# Patient Record
Sex: Female | Born: 1975 | Race: White | Hispanic: No | Marital: Married | Smoking: Never smoker
Health system: Southern US, Community
[De-identification: ages and names within clinical notes are randomized; demographics above are authoritative.]

## PROBLEM LIST (undated history)

## (undated) DIAGNOSIS — B9689 Other specified bacterial agents as the cause of diseases classified elsewhere: Secondary | ICD-10-CM

## (undated) DIAGNOSIS — R609 Edema, unspecified: Secondary | ICD-10-CM

## (undated) DIAGNOSIS — M2550 Pain in unspecified joint: Secondary | ICD-10-CM

## (undated) DIAGNOSIS — R0602 Shortness of breath: Secondary | ICD-10-CM

## (undated) DIAGNOSIS — N898 Other specified noninflammatory disorders of vagina: Secondary | ICD-10-CM

## (undated) DIAGNOSIS — E739 Lactose intolerance, unspecified: Secondary | ICD-10-CM

## (undated) DIAGNOSIS — K219 Gastro-esophageal reflux disease without esophagitis: Secondary | ICD-10-CM

## (undated) DIAGNOSIS — IMO0002 Reserved for concepts with insufficient information to code with codable children: Principal | ICD-10-CM

## (undated) DIAGNOSIS — F419 Anxiety disorder, unspecified: Secondary | ICD-10-CM

## (undated) DIAGNOSIS — R1310 Dysphagia, unspecified: Secondary | ICD-10-CM

## (undated) DIAGNOSIS — K5900 Constipation, unspecified: Secondary | ICD-10-CM

## (undated) DIAGNOSIS — N760 Acute vaginitis: Secondary | ICD-10-CM

## (undated) HISTORY — DX: Other specified bacterial agents as the cause of diseases classified elsewhere: B96.89

## (undated) HISTORY — DX: Constipation, unspecified: K59.00

## (undated) HISTORY — DX: Other specified noninflammatory disorders of vagina: N89.8

## (undated) HISTORY — DX: Dysphagia, unspecified: R13.10

## (undated) HISTORY — DX: Reserved for concepts with insufficient information to code with codable children: IMO0002

## (undated) HISTORY — PX: TUBAL LIGATION: SHX77

## (undated) HISTORY — DX: Lactose intolerance, unspecified: E73.9

## (undated) HISTORY — DX: Pain in unspecified joint: M25.50

## (undated) HISTORY — PX: KNEE ARTHROSCOPY: SHX127

## (undated) HISTORY — PX: MANDIBLE SURGERY: SHX707

## (undated) HISTORY — DX: Shortness of breath: R06.02

## (undated) HISTORY — DX: Edema, unspecified: R60.9

## (undated) HISTORY — DX: Acute vaginitis: N76.0

---

## 1998-01-13 ENCOUNTER — Inpatient Hospital Stay (HOSPITAL_COMMUNITY): Admission: AD | Admit: 1998-01-13 | Discharge: 1998-01-13 | Payer: Self-pay | Admitting: Family Medicine

## 1998-01-29 ENCOUNTER — Inpatient Hospital Stay (HOSPITAL_COMMUNITY): Admission: AD | Admit: 1998-01-29 | Discharge: 1998-01-29 | Payer: Self-pay | Admitting: Family Medicine

## 1998-01-30 ENCOUNTER — Inpatient Hospital Stay (HOSPITAL_COMMUNITY): Admission: AD | Admit: 1998-01-30 | Discharge: 1998-01-30 | Payer: Self-pay | Admitting: Family Medicine

## 1998-02-04 ENCOUNTER — Inpatient Hospital Stay (HOSPITAL_COMMUNITY): Admission: AD | Admit: 1998-02-04 | Discharge: 1998-02-04 | Payer: Self-pay | Admitting: Family Medicine

## 1998-02-19 ENCOUNTER — Ambulatory Visit (HOSPITAL_COMMUNITY): Admission: RE | Admit: 1998-02-19 | Discharge: 1998-02-19 | Payer: Self-pay | Admitting: Family Medicine

## 1998-02-20 ENCOUNTER — Inpatient Hospital Stay (HOSPITAL_COMMUNITY): Admission: AD | Admit: 1998-02-20 | Discharge: 1998-02-20 | Payer: Self-pay | Admitting: Family Medicine

## 1998-03-05 ENCOUNTER — Inpatient Hospital Stay (HOSPITAL_COMMUNITY): Admission: AD | Admit: 1998-03-05 | Discharge: 1998-03-05 | Payer: Self-pay | Admitting: Family Medicine

## 1998-03-21 ENCOUNTER — Inpatient Hospital Stay (HOSPITAL_COMMUNITY): Admission: AD | Admit: 1998-03-21 | Discharge: 1998-03-24 | Payer: Self-pay | Admitting: Family Medicine

## 1998-04-16 ENCOUNTER — Encounter: Admission: RE | Admit: 1998-04-16 | Discharge: 1998-07-15 | Payer: Self-pay | Admitting: Family Medicine

## 1998-05-02 ENCOUNTER — Other Ambulatory Visit: Admission: RE | Admit: 1998-05-02 | Discharge: 1998-05-02 | Payer: Self-pay | Admitting: Family Medicine

## 2000-07-29 ENCOUNTER — Other Ambulatory Visit: Admission: RE | Admit: 2000-07-29 | Discharge: 2000-07-29 | Payer: Self-pay | Admitting: *Deleted

## 2000-07-30 ENCOUNTER — Emergency Department (HOSPITAL_COMMUNITY): Admission: EM | Admit: 2000-07-30 | Discharge: 2000-07-31 | Payer: Self-pay | Admitting: Emergency Medicine

## 2000-07-31 ENCOUNTER — Encounter: Payer: Self-pay | Admitting: Emergency Medicine

## 2001-01-21 ENCOUNTER — Ambulatory Visit (HOSPITAL_COMMUNITY): Admission: RE | Admit: 2001-01-21 | Discharge: 2001-01-21 | Payer: Self-pay | Admitting: Family Medicine

## 2001-01-21 ENCOUNTER — Encounter: Payer: Self-pay | Admitting: Family Medicine

## 2001-02-03 ENCOUNTER — Encounter: Admission: RE | Admit: 2001-02-03 | Discharge: 2001-03-24 | Payer: Self-pay | Admitting: Orthopedic Surgery

## 2001-05-11 ENCOUNTER — Other Ambulatory Visit: Admission: RE | Admit: 2001-05-11 | Discharge: 2001-05-11 | Payer: Self-pay | Admitting: Obstetrics and Gynecology

## 2001-06-10 ENCOUNTER — Ambulatory Visit (HOSPITAL_COMMUNITY): Admission: RE | Admit: 2001-06-10 | Discharge: 2001-06-10 | Payer: Self-pay | Admitting: Obstetrics and Gynecology

## 2001-06-10 ENCOUNTER — Encounter: Payer: Self-pay | Admitting: Obstetrics and Gynecology

## 2001-06-15 ENCOUNTER — Encounter: Payer: Self-pay | Admitting: Obstetrics and Gynecology

## 2001-06-15 ENCOUNTER — Ambulatory Visit (HOSPITAL_COMMUNITY): Admission: RE | Admit: 2001-06-15 | Discharge: 2001-06-15 | Payer: Self-pay | Admitting: Obstetrics and Gynecology

## 2001-07-13 ENCOUNTER — Encounter: Payer: Self-pay | Admitting: Obstetrics and Gynecology

## 2001-07-13 ENCOUNTER — Ambulatory Visit (HOSPITAL_COMMUNITY): Admission: RE | Admit: 2001-07-13 | Discharge: 2001-07-13 | Payer: Self-pay | Admitting: Obstetrics and Gynecology

## 2001-08-17 ENCOUNTER — Inpatient Hospital Stay (HOSPITAL_COMMUNITY): Admission: AD | Admit: 2001-08-17 | Discharge: 2001-08-17 | Payer: Self-pay | Admitting: Obstetrics and Gynecology

## 2001-08-25 ENCOUNTER — Inpatient Hospital Stay (HOSPITAL_COMMUNITY): Admission: RE | Admit: 2001-08-25 | Discharge: 2001-08-25 | Payer: Self-pay | Admitting: Obstetrics and Gynecology

## 2001-09-01 ENCOUNTER — Inpatient Hospital Stay (HOSPITAL_COMMUNITY): Admission: AD | Admit: 2001-09-01 | Discharge: 2001-09-01 | Payer: Self-pay | Admitting: Obstetrics and Gynecology

## 2001-09-02 ENCOUNTER — Inpatient Hospital Stay (HOSPITAL_COMMUNITY): Admission: AD | Admit: 2001-09-02 | Discharge: 2001-09-02 | Payer: Self-pay | Admitting: Obstetrics and Gynecology

## 2001-10-18 ENCOUNTER — Inpatient Hospital Stay (HOSPITAL_COMMUNITY): Admission: AD | Admit: 2001-10-18 | Discharge: 2001-10-18 | Payer: Self-pay | Admitting: Obstetrics and Gynecology

## 2001-10-21 ENCOUNTER — Inpatient Hospital Stay (HOSPITAL_COMMUNITY): Admission: AD | Admit: 2001-10-21 | Discharge: 2001-10-21 | Payer: Self-pay | Admitting: Obstetrics and Gynecology

## 2001-10-28 ENCOUNTER — Inpatient Hospital Stay (HOSPITAL_COMMUNITY): Admission: AD | Admit: 2001-10-28 | Discharge: 2001-10-28 | Payer: Self-pay | Admitting: Obstetrics and Gynecology

## 2001-11-01 ENCOUNTER — Inpatient Hospital Stay (HOSPITAL_COMMUNITY): Admission: AD | Admit: 2001-11-01 | Discharge: 2001-11-03 | Payer: Self-pay | Admitting: Obstetrics and Gynecology

## 2002-01-11 ENCOUNTER — Emergency Department (HOSPITAL_COMMUNITY): Admission: EM | Admit: 2002-01-11 | Discharge: 2002-01-11 | Payer: Self-pay | Admitting: Emergency Medicine

## 2002-01-11 ENCOUNTER — Encounter: Payer: Self-pay | Admitting: Emergency Medicine

## 2002-03-23 ENCOUNTER — Encounter (INDEPENDENT_AMBULATORY_CARE_PROVIDER_SITE_OTHER): Payer: Self-pay | Admitting: *Deleted

## 2002-03-23 ENCOUNTER — Ambulatory Visit (HOSPITAL_BASED_OUTPATIENT_CLINIC_OR_DEPARTMENT_OTHER): Admission: RE | Admit: 2002-03-23 | Discharge: 2002-03-23 | Payer: Self-pay | Admitting: Orthopedic Surgery

## 2002-07-14 ENCOUNTER — Other Ambulatory Visit: Admission: RE | Admit: 2002-07-14 | Discharge: 2002-07-14 | Payer: Self-pay | Admitting: Obstetrics and Gynecology

## 2002-08-16 ENCOUNTER — Ambulatory Visit (HOSPITAL_COMMUNITY): Admission: RE | Admit: 2002-08-16 | Discharge: 2002-08-16 | Payer: Self-pay | Admitting: Obstetrics and Gynecology

## 2002-08-16 ENCOUNTER — Encounter: Payer: Self-pay | Admitting: Obstetrics and Gynecology

## 2003-04-10 ENCOUNTER — Ambulatory Visit (HOSPITAL_COMMUNITY): Admission: RE | Admit: 2003-04-10 | Discharge: 2003-04-10 | Payer: Self-pay | Admitting: Obstetrics and Gynecology

## 2003-04-10 ENCOUNTER — Encounter: Payer: Self-pay | Admitting: Obstetrics and Gynecology

## 2003-06-16 ENCOUNTER — Inpatient Hospital Stay (HOSPITAL_COMMUNITY): Admission: AD | Admit: 2003-06-16 | Discharge: 2003-06-16 | Payer: Self-pay | Admitting: Obstetrics and Gynecology

## 2003-08-23 ENCOUNTER — Inpatient Hospital Stay (HOSPITAL_COMMUNITY): Admission: AD | Admit: 2003-08-23 | Discharge: 2003-08-23 | Payer: Self-pay | Admitting: Obstetrics and Gynecology

## 2003-08-26 ENCOUNTER — Inpatient Hospital Stay (HOSPITAL_COMMUNITY): Admission: AD | Admit: 2003-08-26 | Discharge: 2003-08-26 | Payer: Self-pay | Admitting: Obstetrics and Gynecology

## 2003-08-30 ENCOUNTER — Inpatient Hospital Stay (HOSPITAL_COMMUNITY): Admission: AD | Admit: 2003-08-30 | Discharge: 2003-09-01 | Payer: Self-pay | Admitting: Obstetrics and Gynecology

## 2003-08-31 ENCOUNTER — Encounter (INDEPENDENT_AMBULATORY_CARE_PROVIDER_SITE_OTHER): Payer: Self-pay | Admitting: *Deleted

## 2003-11-01 ENCOUNTER — Other Ambulatory Visit: Admission: RE | Admit: 2003-11-01 | Discharge: 2003-11-01 | Payer: Self-pay | Admitting: Obstetrics and Gynecology

## 2005-10-04 ENCOUNTER — Emergency Department (HOSPITAL_COMMUNITY): Admission: EM | Admit: 2005-10-04 | Discharge: 2005-10-04 | Payer: Self-pay | Admitting: Emergency Medicine

## 2009-01-22 DIAGNOSIS — R109 Unspecified abdominal pain: Secondary | ICD-10-CM | POA: Insufficient documentation

## 2009-01-22 DIAGNOSIS — R110 Nausea: Secondary | ICD-10-CM | POA: Insufficient documentation

## 2009-01-22 DIAGNOSIS — R1011 Right upper quadrant pain: Secondary | ICD-10-CM | POA: Insufficient documentation

## 2009-01-23 ENCOUNTER — Ambulatory Visit: Payer: Self-pay | Admitting: Gastroenterology

## 2009-07-01 ENCOUNTER — Emergency Department (HOSPITAL_COMMUNITY): Admission: EM | Admit: 2009-07-01 | Discharge: 2009-07-01 | Payer: Self-pay | Admitting: Emergency Medicine

## 2010-02-05 ENCOUNTER — Emergency Department (HOSPITAL_COMMUNITY): Admission: EM | Admit: 2010-02-05 | Discharge: 2010-02-05 | Payer: Self-pay | Admitting: Emergency Medicine

## 2010-02-09 ENCOUNTER — Emergency Department (HOSPITAL_COMMUNITY): Admission: EM | Admit: 2010-02-09 | Discharge: 2010-02-09 | Payer: Self-pay | Admitting: Emergency Medicine

## 2011-01-22 LAB — POCT I-STAT, CHEM 8
BUN: 9 mg/dL (ref 6–23)
Calcium, Ion: 1.2 mmol/L (ref 1.12–1.32)
Chloride: 105 mEq/L (ref 96–112)
Creatinine, Ser: 0.7 mg/dL (ref 0.4–1.2)
Glucose, Bld: 98 mg/dL (ref 70–99)
HCT: 40 % (ref 36.0–46.0)
Hemoglobin: 13.6 g/dL (ref 12.0–15.0)
Potassium: 3.7 mEq/L (ref 3.5–5.1)
Sodium: 140 mEq/L (ref 135–145)
TCO2: 25 mmol/L (ref 0–100)

## 2011-01-22 LAB — DIFFERENTIAL
Basophils Absolute: 0.1 10*3/uL (ref 0.0–0.1)
Basophils Relative: 1 % (ref 0–1)
Eosinophils Absolute: 0.3 10*3/uL (ref 0.0–0.7)
Eosinophils Relative: 3 % (ref 0–5)
Lymphocytes Relative: 15 % (ref 12–46)
Lymphs Abs: 1.4 10*3/uL (ref 0.7–4.0)
Monocytes Absolute: 0.4 10*3/uL (ref 0.1–1.0)
Monocytes Relative: 5 % (ref 3–12)
Neutro Abs: 7.1 10*3/uL (ref 1.7–7.7)
Neutrophils Relative %: 76 % (ref 43–77)

## 2011-01-22 LAB — URINALYSIS, ROUTINE W REFLEX MICROSCOPIC
Bilirubin Urine: NEGATIVE
Glucose, UA: NEGATIVE mg/dL
Glucose, UA: NEGATIVE mg/dL
Hgb urine dipstick: NEGATIVE
Ketones, ur: NEGATIVE mg/dL
Ketones, ur: NEGATIVE mg/dL
Nitrite: NEGATIVE
Nitrite: NEGATIVE
Protein, ur: NEGATIVE mg/dL
Protein, ur: NEGATIVE mg/dL
Specific Gravity, Urine: 1.022 (ref 1.005–1.030)
Specific Gravity, Urine: 1.027 (ref 1.005–1.030)
Urobilinogen, UA: 1 mg/dL (ref 0.0–1.0)
Urobilinogen, UA: 1 mg/dL (ref 0.0–1.0)
pH: 5.5 (ref 5.0–8.0)
pH: 7 (ref 5.0–8.0)

## 2011-01-22 LAB — GC/CHLAMYDIA PROBE AMP, GENITAL
Chlamydia, DNA Probe: NEGATIVE
GC Probe Amp, Genital: NEGATIVE

## 2011-01-22 LAB — WET PREP, GENITAL
Trich, Wet Prep: NONE SEEN
Yeast Wet Prep HPF POC: NONE SEEN

## 2011-01-22 LAB — CBC
HCT: 38 % (ref 36.0–46.0)
HCT: 39 % (ref 36.0–46.0)
Hemoglobin: 12.9 g/dL (ref 12.0–15.0)
Hemoglobin: 13.5 g/dL (ref 12.0–15.0)
MCHC: 34 g/dL (ref 30.0–36.0)
MCHC: 34.5 g/dL (ref 30.0–36.0)
MCV: 82.6 fL (ref 78.0–100.0)
MCV: 83.7 fL (ref 78.0–100.0)
Platelets: 214 10*3/uL (ref 150–400)
Platelets: 252 10*3/uL (ref 150–400)
RBC: 4.53 MIL/uL (ref 3.87–5.11)
RBC: 4.73 MIL/uL (ref 3.87–5.11)
RDW: 13.2 % (ref 11.5–15.5)
RDW: 13.4 % (ref 11.5–15.5)
WBC: 9.3 10*3/uL (ref 4.0–10.5)
WBC: 9.6 10*3/uL (ref 4.0–10.5)

## 2011-01-22 LAB — COMPREHENSIVE METABOLIC PANEL
ALT: 29 U/L (ref 0–35)
AST: 33 U/L (ref 0–37)
Albumin: 3.8 g/dL (ref 3.5–5.2)
Alkaline Phosphatase: 63 U/L (ref 39–117)
BUN: 7 mg/dL (ref 6–23)
CO2: 26 mEq/L (ref 19–32)
Calcium: 9 mg/dL (ref 8.4–10.5)
Chloride: 105 mEq/L (ref 96–112)
Creatinine, Ser: 0.86 mg/dL (ref 0.4–1.2)
GFR calc Af Amer: >60 mL/min (ref >60)
GFR calc non Af Amer: >60 mL/min (ref >60)
Glucose, Bld: 101 mg/dL — ABNORMAL HIGH (ref 70–99)
Potassium: 4 mEq/L (ref 3.5–5.1)
Sodium: 137 mEq/L (ref 135–145)
Total Bilirubin: 0.9 mg/dL (ref 0.3–1.2)
Total Protein: 7.2 g/dL (ref 6.0–8.3)

## 2011-01-22 LAB — URINE MICROSCOPIC-ADD ON

## 2011-01-22 LAB — POCT PREGNANCY, URINE: Preg Test, Ur: NEGATIVE

## 2011-01-22 LAB — PREGNANCY, URINE: Preg Test, Ur: NEGATIVE

## 2011-02-08 LAB — COMPREHENSIVE METABOLIC PANEL
ALT: 16 U/L (ref 0–35)
AST: 21 U/L (ref 0–37)
Albumin: 3.3 g/dL — ABNORMAL LOW (ref 3.5–5.2)
Alkaline Phosphatase: 50 U/L (ref 39–117)
BUN: 9 mg/dL (ref 6–23)
CO2: 24 mEq/L (ref 19–32)
Calcium: 8.6 mg/dL (ref 8.4–10.5)
Chloride: 107 mEq/L (ref 96–112)
Creatinine, Ser: 0.9 mg/dL (ref 0.4–1.2)
GFR calc Af Amer: >60 mL/min (ref >60)
GFR calc non Af Amer: >60 mL/min (ref >60)
Glucose, Bld: 99 mg/dL (ref 70–99)
Potassium: 4.4 mEq/L (ref 3.5–5.1)
Sodium: 139 mEq/L (ref 135–145)
Total Bilirubin: 0.5 mg/dL (ref 0.3–1.2)
Total Protein: 6.5 g/dL (ref 6.0–8.3)

## 2011-02-08 LAB — GC/CHLAMYDIA PROBE AMP, GENITAL
Chlamydia, DNA Probe: NEGATIVE
GC Probe Amp, Genital: NEGATIVE

## 2011-02-08 LAB — WET PREP, GENITAL
Trich, Wet Prep: NONE SEEN
Yeast Wet Prep HPF POC: NONE SEEN

## 2011-02-08 LAB — DIFFERENTIAL
Basophils Absolute: 0.1 10*3/uL (ref 0.0–0.1)
Basophils Relative: 1 % (ref 0–1)
Eosinophils Absolute: 0.2 10*3/uL (ref 0.0–0.7)
Eosinophils Relative: 3 % (ref 0–5)
Lymphocytes Relative: 18 % (ref 12–46)
Lymphs Abs: 1.3 10*3/uL (ref 0.7–4.0)
Monocytes Absolute: 0.4 10*3/uL (ref 0.1–1.0)
Monocytes Relative: 6 % (ref 3–12)
Neutro Abs: 5.2 10*3/uL (ref 1.7–7.7)
Neutrophils Relative %: 73 % (ref 43–77)

## 2011-02-08 LAB — URINALYSIS, ROUTINE W REFLEX MICROSCOPIC
Bilirubin Urine: NEGATIVE
Glucose, UA: NEGATIVE mg/dL
Hgb urine dipstick: NEGATIVE
Ketones, ur: NEGATIVE mg/dL
Nitrite: NEGATIVE
Protein, ur: NEGATIVE mg/dL
Specific Gravity, Urine: 1.03 (ref 1.005–1.030)
Urobilinogen, UA: 1 mg/dL (ref 0.0–1.0)
pH: 6 (ref 5.0–8.0)

## 2011-02-08 LAB — CBC
HCT: 36.4 % (ref 36.0–46.0)
Hemoglobin: 12.6 g/dL (ref 12.0–15.0)
MCHC: 34.5 g/dL (ref 30.0–36.0)
MCV: 84.2 fL (ref 78.0–100.0)
Platelets: 213 10*3/uL (ref 150–400)
RBC: 4.33 MIL/uL (ref 3.87–5.11)
RDW: 12.7 % (ref 11.5–15.5)
WBC: 7.2 10*3/uL (ref 4.0–10.5)

## 2011-02-08 LAB — PREGNANCY, URINE: Preg Test, Ur: NEGATIVE

## 2011-03-21 NOTE — Op Note (Signed)
[place]  Patient:    KAMAIYA, ANTILLA Visit Number: [id number] MRN: [id number]          Service Type: DSU Location: [facility] Attending Physician:  Milly Jakob Dictated by:   Harvie Junior, M.D. Proc. Date: 03/23/02 Admit Date:  03/23/2002                             Operative Report  PREOPERATIVE DIAGNOSIS:  Patellofemoral pain.  POSTOPERATIVE DIAGNOSES: 1. Patellofemoral pain with lateral patellar tracking. 2. 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  PROCEDURES: 1. Three-compartment extensive synovectomy. 2. Debridement of chondromalacia, lateral femoral condyle. 3. Lateral retinacular release.  SURGEON:  Harvie Junior, M.D.  ASSISTANT:  Currie Paris. Thedore Mins.  ANESTHESIA:  General.  BRIEF HISTORY:  She is a 35 year old female with a long history of having significant left knee pain with recurrent effusions and pain.  She ultimately had an MRI performed, which showed that she had some patellar thinning on the lateral trochlea.  Because of continued complaints of pain, she was ultimately taken to the operating room for a knee arthroscopy.  DESCRIPTION OF PROCEDURE:  Patient brought to the operating room and after an adequate level of anesthesia was obtained with a general anesthetic, the patient was positioned supine on the operating table.  The left leg was prepped and draped in the usual sterile fashion.  Following this, routine arthroscopic examination of the knee revealed that there was an obvious extensive synovial process that was suspected to be pigmented villonodular synovitis.  The multiple biopsies were taken of this synovial tissue and sent for pathology.  At this point an extensive three-compartment synovectomy was taken medially, laterally, as well as superiorly.  Once the synovectomy was completed, attention was turned back to patellar tracking, where there was some lateral patellar  tracking and some injury to the trochlea on the lateral side, and this was debrided with a suction shaver.  Attention was then turned to the lateral retinaculum, where it was noted to be tilting the patella laterally and there was lateral patellar tracking.  A lateral retinacular release was performed at this point.  Following this a final check was made, and further synovectomy was performed of the extensive synovial process. Following this the knee was copiously irrigated and suctioned dry.  The arthroscopic portals were closed with bandages, a sterile compressive dressing was applied, and the patient was taken to the recovery room, where she was noted to be in satisfactory condition.  Estimated blood loss for the procedure was less than 25 cc. Dictated by:   Harvie Junior, M.D. Attending Physician:  Milly Jakob DD:  03/23/02 TD:  03/24/02 Job: [id number]

## 2011-03-21 NOTE — Discharge Summary (Signed)
NAME:  Susan Wheeler, Susan Wheeler                          ACCOUNT NO.:  [id number]   MEDICAL RECORD NO.:  [id number]                   PATIENT TYPE:  INP   LOCATION:  9131                                 FACILITY:  [facility]   PHYSICIAN:  Huel Cote, M.D.              DATE OF BIRTH:  June 01, 1976   DATE OF ADMISSION:  08/30/2003  DATE OF DISCHARGE:  09/01/2003                                 DISCHARGE SUMMARY   DISCHARGE DIAGNOSES:  1. Term pregnancy at 38 weeks, delivered.  2. Status post normal spontaneous vaginal delivery.  3. Status post postpartum bilateral tubal ligation.   DISCHARGE MEDICATIONS:  1. Motrin 600 mg p.o. q.6h.  2. Percocet one to two tablets p.o. q.4h. p.r.n.   DISCHARGE FOLLOWUP:  The patient is to follow up in the office in two weeks  for an incision check and again in six weeks for her full postpartum  examination.   HOSPITAL COURSE:  The patient is a 35 year old G3, P2-0-0-2 at 38+ weeks who  is admitted for induction of labor given cervical dilation to 3-4 cm and a  history of quick labor living approximately 45 minutes from the hospital.  The patient's prenatal care was essentially uncomplicated.  She was Rh  negative and did receive RhoGAM.   PRENATAL LABORATORIES:  A-.  Antibody negative.  RPR nonreactive.  Rubella  equivocal.  Hepatitis C surface antigen negative.  HIV negative.  GC  negative.  Chlamydia negative.  Group B Strep negative.  Triple screen  negative.   PAST OBSTETRICAL HISTORY:  She had a normal spontaneous vaginal delivery in  1999 of a 7 pound 2 ounce infant at 40 weeks.  In 2002 she had another  vaginal delivery of a 6 pound 6 ounce infant at 38 weeks.   PAST GYN HISTORY:  The patient has conceived on birth control pills and  Ortho-[facility]ra in the past.  She has no abnormal Pap smears.   PAST MEDICAL HISTORY:  Exercise induced asthma and migraines.   PAST SURGICAL HISTORY:  Jaw surgery in 1995 and left knee arthroscopy in  2003.   ALLERGIES:  CODEINE.   PHYSICAL EXAMINATION:  VITAL SIGNS:  She was afebrile on admission with  stable vital signs.  Fetal heart rate was reactive.  CARDIAC:  Regular rate and rhythm.  LUNGS:  Clear.  ABDOMEN:  Gravid, nontender.  PELVIC:  Cervix was 50 and 3-4 at a -2 station.  She had assist of rupture  of membranes performed with clear fluid noted and was placed on low dose  Pitocin.  She reached complete dilation and pushed very well with a normal  spontaneous vaginal delivery of a vigorous female infant over an intact  perineum.  Apgars were 8 and 8.  Weight was 7 pounds 2 ounces.  Placenta  delivered spontaneously.  She had no lacerations and a mild abrasion on the  right upper labia which was  hemostatic.  Estimated blood loss was 350 mL.   She was counseled and desired to proceed with a postpartum tubal ligation  for permanent sterility.  She underwent this procedure on postpartum day #1  without difficulty and was retained in-house until postpartum day #2 when  she was doing very well.  Her incision was clear.  She was afebrile with  stable vital signs and she was felt stable for discharge home.                                               Huel Cote, M.D.    KR/MEDQ  D:  09/29/2003  T:  09/29/2003  Job:  [id number]
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
## 2011-03-21 NOTE — Op Note (Signed)
NAME:  ALETA, MANTERNACH                          ACCOUNT NO.:  [id number]   MEDICAL RECORD NO.:  [id number]                   PATIENT TYPE:  INP   LOCATION:  9131                                 FACILITY:  [facility]   PHYSICIAN:  Huel Cote, M.D.              DATE OF BIRTH:  [birth year]   DATE OF PROCEDURE:  08/31/2003  DATE OF DISCHARGE:                                 OPERATIVE REPORT   PREOPERATIVE DIAGNOSES:  1. Desires permanent sterility.  2. Status post normal spontaneous vaginal delivery.   POSTOPERATIVE DIAGNOSES:  1. Desires permanent sterility.  2. Status post normal spontaneous vaginal delivery.   PROCEDURE:  Postpartum bilateral tubal ligation.   SURGEON:  Huel Cote, M.D.   ANESTHESIA:  Epidural.   FINDINGS:  Normal tubes and ovaries bilaterally.   ESTIMATED BLOOD LOSS:  Minimal.   DESCRIPTION OF PROCEDURE:  The patient was taken to the operating room,  where epidural anesthesia was eventually found to be adequate by Allis clamp  test after dosing.  She was then prepped and draped in the normal sterile  fashion in the dorsal supine position and in slightly Trendelenburg  position.  Lidocaine 0.25% was injected infraumbilically and a small  infraumbilical incision made approximately 2-3 cm in length.  This was  carried through the underlying layer of fascia by sharp dissection and the  peritoneal cavity was entered sharply.  There were no underlying adhesions  noted and the [employer] retractors were then placed within the incision.  The patient's left fallopian tube was identified, grasped with a Babcock  clamp, and traced out to its fimbriated end.  A 2-3 cm buckle of tube was  tied off with two free ties of 0 plain and the tubal segment amputated.  The  pedicles were then cauterized with Bovie cautery and no active bleeding  noted.  In a similar fashion the right tube was grasped with a Babcock and  traced out to its fimbriated end and a 2-3 cm buckle of  tube tented up and  tied off with two free ties of 0 plain.  This was then amputated and again  the free pedicle was cauterized with Bovie cautery.  There was no active  bleeding noted.  The tube was returned to the abdomen and no bleeding was  noted.  The fascia was then closed with 0 Vicryl in a running fashion and  the skin was closed with 3-0 Vicryl in a subcuticular stitch.  Sponge, lap,  and needle counts were correct x2 and the patient was taken to the recovery  room in stable condition.                                                Huel Cote, M.D.    KR/MEDQ  D:  08/31/2003  T:  09/01/2003  Job:  [id number]

## 2014-08-16 ENCOUNTER — Emergency Department (HOSPITAL_BASED_OUTPATIENT_CLINIC_OR_DEPARTMENT_OTHER): Payer: Medicaid Other

## 2014-08-16 ENCOUNTER — Encounter (HOSPITAL_BASED_OUTPATIENT_CLINIC_OR_DEPARTMENT_OTHER): Payer: Self-pay | Admitting: Emergency Medicine

## 2014-08-16 ENCOUNTER — Emergency Department (HOSPITAL_BASED_OUTPATIENT_CLINIC_OR_DEPARTMENT_OTHER)
Admission: EM | Admit: 2014-08-16 | Discharge: 2014-08-17 | Disposition: A | Discharge status: Home / Self Care | Payer: Medicaid Other | Attending: Emergency Medicine | Admitting: Emergency Medicine

## 2014-08-16 ENCOUNTER — Emergency Department (HOSPITAL_COMMUNITY): Payer: Medicaid Other

## 2014-08-16 DIAGNOSIS — Z9851 Tubal ligation status: Secondary | ICD-10-CM | POA: Diagnosis not present

## 2014-08-16 DIAGNOSIS — R1011 Right upper quadrant pain: Secondary | ICD-10-CM | POA: Diagnosis present

## 2014-08-16 DIAGNOSIS — K8050 Calculus of bile duct without cholangitis or cholecystitis without obstruction: Secondary | ICD-10-CM | POA: Insufficient documentation

## 2014-08-16 DIAGNOSIS — Z3202 Encounter for pregnancy test, result negative: Secondary | ICD-10-CM | POA: Diagnosis not present

## 2014-08-16 LAB — URINALYSIS, ROUTINE W REFLEX MICROSCOPIC
Bilirubin Urine: NEGATIVE
Glucose, UA: NEGATIVE mg/dL
Hgb urine dipstick: NEGATIVE
Ketones, ur: NEGATIVE mg/dL
Leukocytes, UA: NEGATIVE
Nitrite: NEGATIVE
Protein, ur: NEGATIVE mg/dL
Specific Gravity, Urine: 1.013 (ref 1.005–1.030)
Urobilinogen, UA: 1 mg/dL (ref 0.0–1.0)
pH: 5.5 (ref 5.0–8.0)

## 2014-08-16 LAB — COMPREHENSIVE METABOLIC PANEL
ALT: 23 U/L (ref 0–35)
AST: 27 U/L (ref 0–37)
Albumin: 4 g/dL (ref 3.5–5.2)
Alkaline Phosphatase: 70 U/L (ref 39–117)
Anion gap: 14 (ref 5–15)
BUN: 10 mg/dL (ref 6–23)
CO2: 24 mEq/L (ref 19–32)
Calcium: 9.7 mg/dL (ref 8.4–10.5)
Chloride: 103 mEq/L (ref 96–112)
Creatinine, Ser: 1 mg/dL (ref 0.50–1.10)
GFR calc Af Amer: 83 mL/min — ABNORMAL LOW (ref >90)
GFR calc non Af Amer: 72 mL/min — ABNORMAL LOW (ref >90)
Glucose, Bld: 92 mg/dL (ref 70–99)
Potassium: 3.9 mEq/L (ref 3.7–5.3)
Sodium: 141 mEq/L (ref 137–147)
Total Bilirubin: 0.2 mg/dL — ABNORMAL LOW (ref 0.3–1.2)
Total Protein: 7.6 g/dL (ref 6.0–8.3)

## 2014-08-16 LAB — CBC WITH DIFFERENTIAL/PLATELET
Basophils Absolute: 0 10*3/uL (ref 0.0–0.1)
Basophils Relative: 0 % (ref 0–1)
Eosinophils Absolute: 0.7 10*3/uL (ref 0.0–0.7)
Eosinophils Relative: 9 % — ABNORMAL HIGH (ref 0–5)
HCT: 36.5 % (ref 36.0–46.0)
Hemoglobin: 12.2 g/dL (ref 12.0–15.0)
Lymphocytes Relative: 27 % (ref 12–46)
Lymphs Abs: 2 10*3/uL (ref 0.7–4.0)
MCH: 27.1 pg (ref 26.0–34.0)
MCHC: 33.4 g/dL (ref 30.0–36.0)
MCV: 81.1 fL (ref 78.0–100.0)
Monocytes Absolute: 0.5 10*3/uL (ref 0.1–1.0)
Monocytes Relative: 7 % (ref 3–12)
Neutro Abs: 4.1 10*3/uL (ref 1.7–7.7)
Neutrophils Relative %: 57 % (ref 43–77)
Platelets: 277 10*3/uL (ref 150–400)
RBC: 4.5 MIL/uL (ref 3.87–5.11)
RDW: 13.4 % (ref 11.5–15.5)
WBC: 7.3 10*3/uL (ref 4.0–10.5)

## 2014-08-16 LAB — PREGNANCY, URINE: Preg Test, Ur: NEGATIVE

## 2014-08-16 MED ORDER — ONDANSETRON HCL 4 MG/2ML IJ SOLN
4.0000 mg | Freq: Once | INTRAMUSCULAR | Status: AC
  Administered 2014-08-16: 4 mg via INTRAVENOUS
  Filled 2014-08-16: qty 2

## 2014-08-16 MED ORDER — SODIUM CHLORIDE 0.9 % IV BOLUS (SEPSIS)
1000.0000 mL | Freq: Once | INTRAVENOUS | Status: AC
  Administered 2014-08-16: 1000 mL via INTRAVENOUS

## 2014-08-16 MED ORDER — SODIUM CHLORIDE 0.9 % IV SOLN: Freq: Once | INTRAVENOUS | Status: DC

## 2014-08-16 MED ORDER — MORPHINE SULFATE 4 MG/ML IJ SOLN
4.0000 mg | INTRAMUSCULAR | Status: DC | PRN
  Administered 2014-08-16 – 2014-08-17 (×2): 4 mg via INTRAVENOUS
  Filled 2014-08-16 (×2): qty 1

## 2014-08-16 NOTE — ED Notes (Signed)
C/o right flank/abd pain x 2 days

## 2014-08-16 NOTE — ED Notes (Signed)
Pt c/o RUQ abd pain with nausea and vomiting x 2 episodes. Pt states pain started yesterday and is worse after eating.

## 2014-08-16 NOTE — ED Provider Notes (Signed)
CSN: [id number]     Arrival date & time 08/16/14  1955 History  This chart was scribed for Rolland PorterMark James, MD by Luisa DagoPriscilla Tutu, ED Scribe. This patient was seen in room MH08/MH08 and the patient's care was started at 12:18 AM.    Chief Complaint  Patient presents with  . Flank Pain   The history is provided by the patient. No language interpreter was used.   HPI Comments: Susan Wheeler is a 38 y.o. female who presents to the Emergency Department complaining of right upper quadrant pain for the last 2-3 days. Right subcostal. Rating her right mid abdomen. No lower abdominal pain. Exacerbated by foods in particular she's noticed any food intolerance for the last week with pain colic. Vomiting intermittently for last 2-3 days she presents here. Denies alcohol use. No excessive anti-inflammatory use. No fevers. No bloody stools or diarrhea. Normal bowel bladder habits. No dark urine or light stools.  History reviewed. No pertinent past medical history. Past Surgical History  Procedure Laterality Date  . Tubal ligation    . Mandible surgery     No family history on file. History  Substance Use Topics  . Smoking status: Never Smoker   . Smokeless tobacco: Not on file  . Alcohol Use: No   OB History   Grav Para Term Preterm Abortions TAB SAB Ect Mult Living                 Review of Systems  Constitutional: Negative for fever, chills, diaphoresis, appetite change and fatigue.  HENT: Negative for mouth sores, sore throat and trouble swallowing.   Eyes: Negative for visual disturbance.  Respiratory: Negative for cough, chest tightness and wheezing.   Gastrointestinal: Negative for nausea, vomiting, diarrhea and abdominal distention.  Endocrine: Negative for polydipsia, polyphagia and polyuria.  Genitourinary: Negative for dysuria, frequency and hematuria.  Musculoskeletal: Negative for gait problem.  Skin: Negative for color change, pallor and rash.  Neurological: Negative for  dizziness, syncope and light-headedness.  Hematological: Does not bruise/bleed easily.  Psychiatric/Behavioral: Negative for behavioral problems and confusion.      Allergies  Codeine  Home Medications   Prior to Admission medications   Medication Sig Start Date End Date Taking? Authorizing Provider  HYDROcodone-acetaminophen (NORCO/VICODIN) 5-325 MG per tablet Take 1 tablet by mouth every 4 (four) hours as needed. 08/17/14   Rolland PorterMark James, MD  ondansetron (ZOFRAN ODT) 4 MG disintegrating tablet Take 1 tablet (4 mg total) by mouth every 8 (eight) hours as needed for nausea. 08/17/14   Rolland PorterMark James, MD   Triage Vitals: BP 120/80  Pulse 81  Temp(Src) 97.9 F (36.6 C) (Oral)  Resp 18  Ht 5\' 3"  (1.6 m)  Wt 188 lb (85.276 kg)  BMI 33.31 kg/m2  SpO2 100%  LMP 08/07/2014  Physical Exam  Constitutional: She is oriented to person, place, and time. She appears well-developed and well-nourished. No distress.  HENT:  Head: Normocephalic.  Eyes: Conjunctivae are normal. Pupils are equal, round, and reactive to light. No scleral icterus.  Neck: Normal range of motion. Neck supple. No thyromegaly present.  Cardiovascular: Normal rate and regular rhythm.  Exam reveals no gallop and no friction rub.   No murmur heard. Pulmonary/Chest: Effort normal and breath sounds normal. No respiratory distress. She has no wheezes. She has no rales.  Abdominal: Soft. Bowel sounds are normal. She exhibits no distension. There is tenderness. There is no rebound.    Musculoskeletal: Normal range of motion.  Neurological: She  is alert and oriented to person, place, and time.  Skin: Skin is warm and dry. No rash noted.  Psychiatric: She has a normal mood and affect. Her behavior is normal.    ED Course  Procedures (including critical care time)  DIAGNOSTIC STUDIES: Oxygen Saturation is 100% on RA, normal by my interpretation.    COORDINATION OF CARE: 12:18 AM- Pt advised of plan for treatment and pt  agrees.  Labs Review Labs Reviewed  CBC WITH DIFFERENTIAL - Abnormal; Notable for the following:    Eosinophils Relative 9 (*)    All other components within normal limits  COMPREHENSIVE METABOLIC PANEL - Abnormal; Notable for the following:    Total Bilirubin 0.2 (*)    GFR calc non Af Amer 72 (*)    GFR calc Af Amer 83 (*)    All other components within normal limits  URINALYSIS, ROUTINE W REFLEX MICROSCOPIC  PREGNANCY, URINE    Imaging Review [place] Abdomen Complete  08/17/2014   CLINICAL DATA:  Right upper quadrant abdominal pain for several days. Initial encounter.  EXAM: ULTRASOUND ABDOMEN COMPLETE  COMPARISON:  CT abdomen pelvis -02/09/2010  FINDINGS: Gallbladder: There is a minimal amount of layering echogenic debris within an otherwise normal-appearing gallbladder (image 34). No gallbladder wall thickening or pericholecystic fluid. Negative sonographic Murphy's sign  Common bile duct: Diameter: Normal in size measuring 2 mm in diameter  Liver: Homogeneous hepatic echotexture. No discrete hepatic lesions. No definite evidence of intrahepatic biliary ductal dilatation. No ascites.  IVC: No abnormality visualized.  Pancreas: Limited visualization of the pancreatic head and neck is normal. Visualization of the pancreatic body and tail is obscured by bowel gas.  Spleen: Normal in size measuring 11 cm in length.  Right Kidney: Normal cortical thickness, echogenicity and size, measuring 11.5 cm in length. No focal renal lesions. No echogenic renal stones. No urinary obstruction.  Left Kidney: Normal cortical thickness, echogenicity and size, measuring 10.3 cm in length. Note is made of an approximately 0.9 x 0.6 x 0.5 cm anechoic lesion within inferior pole left kidney which is too small to adequately characterize though potentially representative of a renal cyst. No echogenic renal stones. No urinary obstruction.  Abdominal aorta: No aneurysm visualized.  Other findings: None.  IMPRESSION:  Echogenic sludge within otherwise normal-appearing gallbladder. Otherwise, no explanation for patient's right upper quadrant abdominal pain. If concern persists for acute cholecystitis, further evaluation could be performed with a HIDA scan as clinically indicated.   Electronically Signed   By: Simonne ComeJohn  Watts M.D.   On: 08/17/2014 00:03     EKG Interpretation None      MDM   Final diagnoses:  Biliary colic  Right upper quadrant pain    Patient remained essentially asymptomatic. Sludge in the gallbladder on ultrasound but no signs of acute cholecystitis. No elevated enzymes. Reexam shows no right lower cord or tenderness. I discussed with her that she may require hiatus scan for confirmation of her symptoms are classically biliary. Plan is low-fat diet, pain control, surgical referral.    Rolland PorterMark James, MD 08/17/14 [phone]

## 2014-08-17 MED ORDER — ONDANSETRON 4 MG PO TBDP: 4.0000 mg | ORAL_TABLET | Freq: Three times a day (TID) | ORAL | Status: DC | PRN

## 2014-08-17 MED ORDER — HYDROCODONE-ACETAMINOPHEN 5-325 MG PO TABS: 1.0000 | ORAL_TABLET | ORAL | Status: DC | PRN

## 2014-08-17 NOTE — Discharge Instructions (Signed)
Call the surgical office above to schedule an outpatient appointment. You may require additional tests to confirm your gallbladder is causing your symptoms. Low-fat diet.  Abdominal Pain Many things can cause abdominal pain. Usually, abdominal pain is not caused by a disease and will improve without treatment. It can often be observed and treated at home. Your health care provider will do a physical exam and possibly order blood tests and X-rays to help determine the seriousness of your pain. However, in many cases, more time must pass before a clear cause of the pain can be found. Before that point, your health care provider may not know if you need more testing or further treatment. HOME CARE INSTRUCTIONS  Monitor your abdominal pain for any changes. The following actions may help to alleviate any discomfort you are experiencing:  Only take over-the-counter or prescription medicines as directed by your health care provider.  Do not take laxatives unless directed to do so by your health care provider.  Try a clear liquid diet (broth, tea, or water) as directed by your health care provider. Slowly move to a bland diet as tolerated. SEEK MEDICAL CARE IF:  You have unexplained abdominal pain.  You have abdominal pain associated with nausea or diarrhea.  You have pain when you urinate or have a bowel movement.  You experience abdominal pain that wakes you in the night.  You have abdominal pain that is worsened or improved by eating food.  You have abdominal pain that is worsened with eating fatty foods.  You have a fever. SEEK IMMEDIATE MEDICAL CARE IF:   Your pain does not go away within 2 hours.  You keep throwing up (vomiting).  Your pain is felt only in portions of the abdomen, such as the right side or the left lower portion of the abdomen.  You pass bloody or black tarry stools. MAKE SURE YOU:  Understand these instructions.   Will watch your condition.   Will get  help right away if you are not doing well or get worse.  Document Released: 07/30/2005 Document Revised: 10/25/2013 Document Reviewed: 06/29/2013 [facility] IncExitCare Patient Information 2015 [place], [place]. This information is not intended to replace advice given to you by your health care provider. Make sure you discuss any questions you have with your health care provider.  Biliary Colic  Biliary colic is a steady or irregular pain in the upper abdomen. It is usually under the right side of the rib cage. It happens when gallstones interfere with the normal flow of bile from the gallbladder. Bile is a liquid that helps to digest fats. Bile is made in the liver and stored in the gallbladder. When you eat a meal, bile passes from the gallbladder through the cystic duct and the common bile duct into the small intestine. There, it mixes with partially digested food. If a gallstone blocks either of these ducts, the normal flow of bile is blocked. The muscle cells in the bile duct contract forcefully to try to move the stone. This causes the pain of biliary colic.  SYMPTOMS   A person with biliary colic usually complains of pain in the upper abdomen. This pain can be:  In the center of the upper abdomen just below the breastbone.  In the upper-right part of the abdomen, near the gallbladder and liver.  Spread back toward the right shoulder blade.  Nausea and vomiting.  The pain usually occurs after eating.  Biliary colic is usually triggered by the digestive system's demand  for bile. The demand for bile is high after fatty meals. Symptoms can also occur when a person who has been fasting suddenly eats a very large meal. Most episodes of biliary colic pass after 1 to 5 hours. After the most intense pain passes, your abdomen may continue to ache mildly for about 24 hours. DIAGNOSIS  After you describe your symptoms, your caregiver will perform a physical exam. He or she will pay attention to the upper right  portion of your belly (abdomen). This is the area of your liver and gallbladder. An ultrasound will help your caregiver look for gallstones. Specialized scans of the gallbladder may also be done. Blood tests may be done, especially if you have fever or if your pain persists. PREVENTION  Biliary colic can be prevented by controlling the risk factors for gallstones. Some of these risk factors, such as heredity, increasing age, and pregnancy are a normal part of life. Obesity and a high-fat diet are risk factors you can change through a healthy lifestyle. Women going through menopause who take hormone replacement therapy (estrogen) are also more likely to develop biliary colic. TREATMENT   Pain medication may be prescribed.  You may be encouraged to eat a fat-free diet.  If the first episode of biliary colic is severe, or episodes of colic keep retuning, surgery to remove the gallbladder (cholecystectomy) is usually recommended. This procedure can be done through small incisions using an instrument called a laparoscope. The procedure often requires a brief stay in the hospital. Some people can leave the hospital the same day. It is the most widely used treatment in people troubled by painful gallstones. It is effective and safe, with no complications in more than 90% of cases.  If surgery cannot be done, medication that dissolves gallstones may be used. This medication is expensive and can take months or years to work. Only small stones will dissolve.  Rarely, medication to dissolve gallstones is combined with a procedure called shock-wave lithotripsy. This procedure uses carefully aimed shock waves to break up gallstones. In many people treated with this procedure, gallstones form again within a few years. PROGNOSIS  If gallstones block your cystic duct or common bile duct, you are at risk for repeated episodes of biliary colic. There is also a 25% chance that you will develop a gallbladder  infection(acute cholecystitis), or some other complication of gallstones within 10 to 20 years. If you have surgery, schedule it at a time that is convenient for you and at a time when you are not sick. HOME CARE INSTRUCTIONS   Drink plenty of clear fluids.  Avoid fatty, greasy or fried foods, or any foods that make your pain worse.  Take medications as directed. SEEK MEDICAL CARE IF:   You develop a fever over 100.5 F (38.1 C).  Your pain gets worse over time.  You develop nausea that prevents you from eating and drinking.  You develop vomiting. SEEK IMMEDIATE MEDICAL CARE IF:   You have continuous or severe belly (abdominal) pain which is not relieved with medications.  You develop nausea and vomiting which is not relieved with medications.  You have symptoms of biliary colic and you suddenly develop a fever and shaking chills. This may signal cholecystitis. Call your caregiver immediately.  You develop a yellow color to your skin or the white part of your eyes (jaundice). Document Released: 03/23/2006 Document Revised: 01/12/2012 Document Reviewed: 06/01/2008 [facility]Exit[facility] Patient Information 2015 [place], [place]LL[place]. This information is not intended to replace advice  given to you by your health care provider. Make sure you discuss any questions you have with your health care provider.

## 2014-08-17 NOTE — ED Notes (Signed)
Pt asking for resources for financial assistance. I called the social work office and left a voicemail to have them call pt tomorrow with contact information. I also instructed pt call financial assistance department tomorrow.

## 2014-08-21 ENCOUNTER — Emergency Department (HOSPITAL_BASED_OUTPATIENT_CLINIC_OR_DEPARTMENT_OTHER)
Admission: EM | Admit: 2014-08-21 | Discharge: 2014-08-21 | Disposition: A | Discharge status: Home / Self Care | Payer: Medicaid Other | Attending: Emergency Medicine | Admitting: Emergency Medicine

## 2014-08-21 ENCOUNTER — Encounter (HOSPITAL_BASED_OUTPATIENT_CLINIC_OR_DEPARTMENT_OTHER): Payer: Self-pay | Admitting: Emergency Medicine

## 2014-08-21 ENCOUNTER — Emergency Department (HOSPITAL_BASED_OUTPATIENT_CLINIC_OR_DEPARTMENT_OTHER): Payer: Medicaid Other

## 2014-08-21 DIAGNOSIS — Z9851 Tubal ligation status: Secondary | ICD-10-CM | POA: Diagnosis not present

## 2014-08-21 DIAGNOSIS — R1011 Right upper quadrant pain: Secondary | ICD-10-CM | POA: Diagnosis not present

## 2014-08-21 DIAGNOSIS — Z79899 Other long term (current) drug therapy: Secondary | ICD-10-CM | POA: Insufficient documentation

## 2014-08-21 DIAGNOSIS — R112 Nausea with vomiting, unspecified: Secondary | ICD-10-CM | POA: Diagnosis not present

## 2014-08-21 DIAGNOSIS — R1013 Epigastric pain: Secondary | ICD-10-CM | POA: Insufficient documentation

## 2014-08-21 DIAGNOSIS — Z3202 Encounter for pregnancy test, result negative: Secondary | ICD-10-CM | POA: Insufficient documentation

## 2014-08-21 LAB — URINALYSIS, ROUTINE W REFLEX MICROSCOPIC
Bilirubin Urine: NEGATIVE
Glucose, UA: NEGATIVE mg/dL
Hgb urine dipstick: NEGATIVE
Ketones, ur: 15 mg/dL — AB
Nitrite: NEGATIVE
Protein, ur: NEGATIVE mg/dL
Specific Gravity, Urine: 1.027 (ref 1.005–1.030)
Urobilinogen, UA: 1 mg/dL (ref 0.0–1.0)
pH: 5.5 (ref 5.0–8.0)

## 2014-08-21 LAB — COMPREHENSIVE METABOLIC PANEL
ALT: 21 U/L (ref 0–35)
AST: 23 U/L (ref 0–37)
Albumin: 3.9 g/dL (ref 3.5–5.2)
Alkaline Phosphatase: 70 U/L (ref 39–117)
Anion gap: 14 (ref 5–15)
BUN: 12 mg/dL (ref 6–23)
CO2: 24 mEq/L (ref 19–32)
Calcium: 9.7 mg/dL (ref 8.4–10.5)
Chloride: 102 mEq/L (ref 96–112)
Creatinine, Ser: 1 mg/dL (ref 0.50–1.10)
GFR calc Af Amer: 83 mL/min — ABNORMAL LOW (ref >90)
GFR calc non Af Amer: 72 mL/min — ABNORMAL LOW (ref >90)
Glucose, Bld: 102 mg/dL — ABNORMAL HIGH (ref 70–99)
Potassium: 4.1 mEq/L (ref 3.7–5.3)
Sodium: 140 mEq/L (ref 137–147)
Total Bilirubin: 0.4 mg/dL (ref 0.3–1.2)
Total Protein: 7.6 g/dL (ref 6.0–8.3)

## 2014-08-21 LAB — CBC WITH DIFFERENTIAL/PLATELET
Basophils Absolute: 0 10*3/uL (ref 0.0–0.1)
Basophils Relative: 1 % (ref 0–1)
Eosinophils Absolute: 0.4 10*3/uL (ref 0.0–0.7)
Eosinophils Relative: 7 % — ABNORMAL HIGH (ref 0–5)
HCT: 35.9 % — ABNORMAL LOW (ref 36.0–46.0)
Hemoglobin: 12.1 g/dL (ref 12.0–15.0)
Lymphocytes Relative: 29 % (ref 12–46)
Lymphs Abs: 1.8 10*3/uL (ref 0.7–4.0)
MCH: 26.9 pg (ref 26.0–34.0)
MCHC: 33.7 g/dL (ref 30.0–36.0)
MCV: 79.8 fL (ref 78.0–100.0)
Monocytes Absolute: 0.4 10*3/uL (ref 0.1–1.0)
Monocytes Relative: 7 % (ref 3–12)
Neutro Abs: 3.5 10*3/uL (ref 1.7–7.7)
Neutrophils Relative %: 56 % (ref 43–77)
Platelets: 248 10*3/uL (ref 150–400)
RBC: 4.5 MIL/uL (ref 3.87–5.11)
RDW: 13.3 % (ref 11.5–15.5)
WBC: 6.1 10*3/uL (ref 4.0–10.5)

## 2014-08-21 LAB — URINE MICROSCOPIC-ADD ON

## 2014-08-21 LAB — PREGNANCY, URINE: Preg Test, Ur: NEGATIVE

## 2014-08-21 LAB — LIPASE, BLOOD: Lipase: 33 U/L (ref 11–59)

## 2014-08-21 MED ORDER — KETOROLAC TROMETHAMINE 30 MG/ML IJ SOLN
30.0000 mg | Freq: Once | INTRAMUSCULAR | Status: AC
  Administered 2014-08-21: 30 mg via INTRAVENOUS
  Filled 2014-08-21: qty 1

## 2014-08-21 MED ORDER — HYDROMORPHONE HCL 1 MG/ML IJ SOLN
0.5000 mg | Freq: Once | INTRAMUSCULAR | Status: AC
  Administered 2014-08-21: 0.5 mg via INTRAVENOUS
  Filled 2014-08-21: qty 1

## 2014-08-21 MED ORDER — ONDANSETRON HCL 4 MG/2ML IJ SOLN
4.0000 mg | Freq: Once | INTRAMUSCULAR | Status: AC
  Administered 2014-08-21: 4 mg via INTRAVENOUS
  Filled 2014-08-21: qty 2

## 2014-08-21 MED ORDER — PROMETHAZINE HCL 25 MG PO TABS: 25.0000 mg | ORAL_TABLET | Freq: Four times a day (QID) | ORAL | Status: DC | PRN

## 2014-08-21 MED ORDER — HYDROMORPHONE HCL 1 MG/ML IJ SOLN
1.0000 mg | Freq: Once | INTRAMUSCULAR | Status: AC
  Administered 2014-08-21: 1 mg via INTRAVENOUS
  Filled 2014-08-21: qty 1

## 2014-08-21 MED ORDER — OXYCODONE-ACETAMINOPHEN 5-325 MG PO TABS: 1.0000 | ORAL_TABLET | Freq: Four times a day (QID) | ORAL | Status: DC | PRN

## 2014-08-21 MED ORDER — MORPHINE SULFATE 4 MG/ML IJ SOLN
4.0000 mg | Freq: Once | INTRAMUSCULAR | Status: AC
  Administered 2014-08-21: 4 mg via INTRAVENOUS
  Filled 2014-08-21: qty 1

## 2014-08-21 MED ORDER — SODIUM CHLORIDE 0.9 % IV BOLUS (SEPSIS)
1000.0000 mL | Freq: Once | INTRAVENOUS | Status: AC
  Administered 2014-08-21: 1000 mL via INTRAVENOUS

## 2014-08-21 NOTE — Discharge Instructions (Signed)
Cholecystogram Your caregiver has suggested that you have a cholecystogram or X-ray of your gallbladder. Your liver is the organ in your body that produces bile. The bile then goes from the liver to the gallbladder where the bile is stored and concentrated. The bile is then excreted into the small intestine when the bile is needed for digestion. Because the bile is always needed for fat processing, it is the reason you generally will feel a gallbladder attack (abdominal pain with occasional nausea and vomiting) after eating a fatty meal. If a stone blocks the tube (duct) leading from the gallbladder to the small intestine, this can cause an inflammation of the gallbladder (cholecystitis). PROCEDURE   You will be given a low fat diet the night prior to your exam.  You will be given several capsules to take. These contain a dye (iodine) that is excreted by your liver into the gallbladder. This dye outlines the gallbladder when X-ray films are taken.  During the procedure you will be given a small glass of liquid to take. This allows your caregiver to see how your gallbladder contracts.  A specialist in reading X-rays (radiologist) can then evaluate the films and give his or her impression to your caregiver.  Following the test you may go home unless otherwise instructed and resume normal activities and diet as instructed. Not all test results are available during your visit. If your test results are not back during the visit, make an appointment with your caregiver to find out the results. Do not assume everything is normal if you have not heard from your caregiver or the medical facility. It is important for you to follow up on all of your test results.  Document Released: 10/17/2000 Document Revised: 01/12/2012 Document Reviewed: 05/25/2009 [facility]ExitCare Patient Information 2015 [place]ExitCare, [place]LLC. This information is not intended to replace advice given to you by your health care provider. Make sure you  discuss any questions you have with your health care provider.

## 2014-08-21 NOTE — ED Notes (Signed)
PA aware of BP

## 2014-08-21 NOTE — ED Provider Notes (Signed)
CSN: [id number]     Arrival date & time 08/21/14  [id number] History   First MD Initiated Contact with Patient 08/21/14 1003     Chief Complaint  Patient presents with  . Abdominal Pain     (Consider location/radiation/quality/duration/timing/severity/associated sxs/prior Treatment) HPI  Patient to the ER with complaints of right upper quadrant pain and epigastric pain that radiates to the low back. She is due to see surgery on Thursday but says she could not handle the pain. She was seen a couple of days ago by an EDP and rx'd nausea and pain medications. She did not get them filled because the Zofran was $50. Therefore she has been vomiting and not tolerating PO at home. She has not had fevers, diarrhea, weakness, confusion, dysuria, or headaches.  History reviewed. No pertinent past medical history. Past Surgical History  Procedure Laterality Date  . Tubal ligation    . Mandible surgery     History reviewed. No pertinent family history. History  Substance Use Topics  . Smoking status: Never Smoker   . Smokeless tobacco: Not on file  . Alcohol Use: No   OB History   Grav Para Term Preterm Abortions TAB SAB Ect Mult Living                 Review of Systems  Gastrointestinal: Positive for nausea, vomiting and abdominal pain.  All other systems reviewed and are negative.     Allergies  Codeine  Home Medications   Prior to Admission medications   Medication Sig Start Date End Date Taking? Authorizing Provider  HYDROcodone-acetaminophen (NORCO/VICODIN) 5-325 MG per tablet Take 1 tablet by mouth every 4 (four) hours as needed. 08/17/14   Rolland PorterMark James, MD  ondansetron (ZOFRAN ODT) 4 MG disintegrating tablet Take 1 tablet (4 mg total) by mouth every 8 (eight) hours as needed for nausea. 08/17/14   Rolland PorterMark James, MD  oxyCODONE-acetaminophen (PERCOCET/ROXICET) 5-325 MG per tablet Take 1-2 tablets by mouth every 6 (six) hours as needed for severe pain. 08/21/14   Tiffany Irine SealG Greene, PA-C   promethazine (PHENERGAN) 25 MG tablet Take 1 tablet (25 mg total) by mouth every 6 (six) hours as needed for nausea or vomiting. 08/21/14   Tiffany Irine SealG Greene, PA-C   BP 111/71  Pulse 80  Temp(Src) 99.3 F (37.4 C) (Oral)  Resp 16  Ht 5\' 4"  (1.626 m)  Wt 188 lb (85.276 kg)  BMI 32.25 kg/m2  SpO2 100%  LMP 08/07/2014 Physical Exam  Nursing note and vitals reviewed. Constitutional: She appears well-developed and well-nourished. No distress.  HENT:  Head: Normocephalic and atraumatic.  Eyes: Pupils are equal, round, and reactive to light.  Neck: Normal range of motion. Neck supple.  Cardiovascular: Normal rate and regular rhythm.   Pulmonary/Chest: Effort normal.  Abdominal: Soft. There is tenderness (mild/moderate). There is no rigidity, no rebound, no guarding, no CVA tenderness and negative Murphy's sign.  Neurological: She is alert.  Skin: Skin is warm and dry.    ED Course  Procedures (including critical care time) Labs Review Labs Reviewed  CBC WITH DIFFERENTIAL - Abnormal; Notable for the following:    HCT 35.9 (*)    Eosinophils Relative 7 (*)    All other components within normal limits  COMPREHENSIVE METABOLIC PANEL - Abnormal; Notable for the following:    Glucose, Bld 102 (*)    GFR calc non Af Amer 72 (*)    GFR calc Af Amer 83 (*)    All other  components within normal limits  URINALYSIS, ROUTINE W REFLEX MICROSCOPIC - Abnormal; Notable for the following:    APPearance CLOUDY (*)    Ketones, ur 15 (*)    Leukocytes, UA TRACE (*)    All other components within normal limits  URINE MICROSCOPIC-ADD ON - Abnormal; Notable for the following:    Squamous Epithelial / LPF FEW (*)    Bacteria, UA MANY (*)    All other components within normal limits  LIPASE, BLOOD  PREGNANCY, URINE    Imaging Review [place] Abdomen Limited Ruq  08/21/2014   CLINICAL DATA:  38 year old female with right upper quadrant pain, nausea and vomiting for 2 weeks getting worse since  08/16/2014. Subsequent encounter.  EXAM: US ABDOMEN LIMITED - RIGHT UPPER QUADRANT  COMPARISON:  None.  FINDINGS: Gallbladder:  Gallbladder is full. No gallstones or sludge detected. No pericholecystic fluid or gallbladder wall thickening. Per ultrasound technologist, the patient was not tender over this region during scanning.  Common bile duct:  Diameter: 2 mm. Distal aspect not visualized secondary to bowel gas.  Liver:  Mild increased echogenicity may represent fatty infiltration. No focal mass or intrahepatic biliary duct dilation.  IMPRESSION: Gallbladder is full. No gallstones or sludge detected. No pericholecystic fluid or gallbladder wall thickening. Per ultrasound technologist, the patient was not tender over this region during scanning.  Mild increased echogenicity of the liver may represent fatty infiltration. No focal mass or intrahepatic biliary duct dilation.   Electronically Signed   By: Bridgett LarssonSteve  Olson M.D.   On: 08/21/2014 16:01     EKG Interpretation None      MDM   Final diagnoses:  RUQ pain   2:29 pm The patient had a sonogram done and it showed sludge but no signs of cholecystitis. She had no pain/nausea medication  At home to control symptoms and therefore came back to be seen again in the ED. Her symptoms are difficult to control with IV pain medications her labs are reassuring and not consistent with pts symptoms. Will ordere limited abd US to re-evaluate RUQ due to difficulty of controlling pain to r/o acute cholecystitis.  4:30 pm Patient received significant amount of pain treatment in the ED. She says she still has pain but has no guarding or tenderness to her belly. I repeated US which shows no cholecystitis or emergent findings. Technician confirms she had no tenderness on US. She tells me that she does not have insurance and would like to see a Careers advisersurgeon here. Unfortunately, she appears well, is tolerating PO here and has not had any medication to control her symptoms at  home. She has appt to see surgeon on Thursday that she will have to go to.  Medications  HYDROmorphone (DILAUDID) injection 0.5 mg (not administered)  ondansetron (ZOFRAN) injection 4 mg (not administered)  sodium chloride 0.9 % bolus 1,000 mL (0 mLs Intravenous Stopped 08/21/14 1334)  ondansetron (ZOFRAN) injection 4 mg (4 mg Intravenous Given 08/21/14 1223)  morphine 4 MG/ML injection 4 mg (4 mg Intravenous Given 08/21/14 1224)  ketorolac (TORADOL) 30 MG/ML injection 30 mg (30 mg Intravenous Given 08/21/14 1222)  HYDROmorphone (DILAUDID) injection 1 mg (1 mg Intravenous Given 08/21/14 1335)    38 y.o.Susan Linerracie S Purdy's evaluation in the Emergency Department is complete. It has been determined that no acute conditions requiring further emergency intervention are present at this time. The patient/guardian have been advised of the diagnosis and plan. We have discussed signs and symptoms that warrant return to the ED,  such as changes or worsening in symptoms.  Vital signs are stable at discharge. Filed Vitals:   08/21/14 1628  BP: 111/71  Pulse: 80  Temp:   Resp:     Patient/guardian has voiced understanding and agreed to follow-up with the PCP or specialist.    Dorthula Matas, PA-C 08/21/14 1633

## 2014-08-21 NOTE — ED Notes (Signed)
MD at bedside.  Ginger Ale has been given

## 2014-08-21 NOTE — ED Notes (Signed)
Pt c/o right side pain that radiates into low back. Pt states she is not able to see surgeon until later this week and could not handle the pain.

## 2014-08-22 NOTE — ED Provider Notes (Signed)
Medical screening examination/treatment/procedure(s) were performed by non-physician practitioner and as supervising physician I was immediately available for consultation/collaboration.   EKG Interpretation None        Rolan BuccoMelanie Belfi, MD 08/22/14 [phone]

## 2014-09-07 NOTE — H&P (Signed)
  NTS SOAP Note  Vital Signs:  Vitals as of: 09/05/2014: Systolic 125: Diastolic 80: Heart Rate 89: Temp 98.38F: Height 485ft 4in: Weight 191Lbs 0 Ounces: Pain Level 5: BMI 32.78  BMI : 32.78 kg/m2  Subjective: This 38 year old female presents for of right upper quadrant abdominal pain.  Has been having right upper quadrant abdominal pain,  nausea,  and fatty food intolerance for over one months.  Is getting worse.  No fever,  chills,  jaundice.  Pain radiates around right flank.  Has had several ER visits.  CT scan showed sludge,  U/S was unremarkable.     Review of Symptoms:  Constitutional:unremarkable   Head:unremarkable Eyes:unremarkable   Nose/Mouth/Throat:unremarkable Cardiovascular:  unremarkable Respiratory:unremarkable Gastrointestinal:    abdominal pain, nausea Genitourinary:unremarkable   Musculoskeletal:unremarkable Skin:unremarkable Hematolgic/Lymphatic:unremarkable   Allergic/Immunologic:unremarkable   Past Medical History:  Reviewed  Past Medical History  Surgical History: mandible surgery,  BTL Medical Problems: none Allergies: codeine  Medications: none   Social History:Reviewed  Social History  Preferred Language: English Race:  White Ethnicity: Not Hispanic / Latino Age: 38 year Marital Status:  M Alcohol: no   Smoking Status: Never smoker reviewed on 09/05/2014 Functional Status reviewed on 09/05/2014 ------------------------------------------------ Bathing: Normal Cooking: Normal Dressing: Normal Driving: Normal Eating: Normal Managing Meds: Normal Oral Care: Normal Shopping: Normal Toileting: Normal Transferring: Normal Walking: Normal Cognitive Status reviewed on 09/05/2014 ------------------------------------------------ Attention: Normal Decision Making: Normal Language: Normal Memory: Normal Motor: Normal Perception: Normal Problem Solving: Normal Visual and Spatial: Normal   Family  History:Reviewed  Family Health History Mother, Healthy;  Father, Healthy;     Objective Information: General:Well appearing, well nourished in no distress. Heart:RRR, no murmur or gallop.  Normal S1, S2.  No S3, S4.  Lungs:  CTA bilaterally, no wheezes, rhonchi, rales.  Breathing unlabored. Abdomen:Soft, tenderness in right upper quadrant to patlption,  ND, normal bowel sounds, no HSM, no masses.  No peritoneal signs.  Assessment:Chronic cholecystitis  Diagnoses: 575.11  K81.1 Chronic cholecystitis (Chronic cholecystitis)  Procedures: [id number] - OFFICE OUTPATIENT NEW 30 MINUTES    Plan:  Scheduled for laparoscopic cholecystectomy on 09/20/14.   Patient Education:Alternative treatments to surgery were discussed with patient (and family).  Risks and benefits  of procedure including bleeding,  infection,  hepatobiliary injury,  and the possibility of an open procedure were fully explained to the patient (and family) who gave informed consent. Patient/family questions were addressed.  Follow-up:Pending Surgery

## 2014-09-13 NOTE — Patient Instructions (Signed)
Susan Wheeler  09/13/2014   Your procedure is scheduled on:   09/17/2014  [facility] at 930  AM.  Call this number if you have problems the morning of surgery: [phone]   Remember:   Do not eat food or drink liquids after midnight.   Take these medicines the morning of surgery with A SIP OF WATER: hydrocodone or (oxycodone), zofran or ( phenergan).   Do not wear jewelry, make-up or nail polish.  Do not wear lotions, powders, or perfumes.   Do not shave 48 hours prior to surgery. Men may shave face and neck.  Do not bring valuables to the hospital.  [facility] is not responsible for any belongings or valuables.               Contacts, dentures or bridgework may not be worn into surgery.  Leave suitcase in the car. After surgery it may be brought to your room.  For patients admitted to the hospital, discharge time is determined by your treatment team.               Patients discharged the day of surgery will not be allowed to drive home.  Name and phone number of your driver: family  Special Instructions: Shower using CHG 2 nights before surgery and the night before surgery.  If you shower the day of surgery use CHG.  Use special wash - you have one bottle of CHG for all showers.  You should use approximately 1/3 of the bottle for each shower.   Please read over the following fact sheets that you were given: Pain Booklet, Coughing and Deep Breathing, Surgical Site Infection Prevention, Anesthesia Post-op Instructions and Care and Recovery After Surgery Laparoscopic Cholecystectomy Laparoscopic cholecystectomy is surgery to remove the gallbladder. The gallbladder is located in the upper right part of the abdomen, behind the liver. It is a storage sac for bile produced in the liver. Bile aids in the digestion and absorption of fats. Cholecystectomy is often done for inflammation of the gallbladder (cholecystitis). This condition is usually caused by a buildup of  gallstones (cholelithiasis) in your gallbladder. Gallstones can block the flow of bile, resulting in inflammation and pain. In severe cases, emergency surgery may be required. When emergency surgery is not required, you will have time to prepare for the procedure. Laparoscopic surgery is an alternative to open surgery. Laparoscopic surgery has a shorter recovery time. Your common bile duct may also need to be examined during the procedure. If stones are found in the common bile duct, they may be removed. LET [facility]ARE PROVIDER KNOW ABOUT:  Any allergies you have.  All medicines you are taking, including vitamins, herbs, eye drops, creams, and over-the-counter medicines.  Previous problems you or members of your family have had with the use of anesthetics.  Any blood disorders you have.  Previous surgeries you have had.  Medical conditions you have. RISKS AND COMPLICATIONS Generally, this is a safe procedure. However, as with any procedure, complications can occur. Possible complications include:  Infection.  Damage to the common bile duct, nerves, arteries, veins, or other internal organs such as the stomach, liver, or intestines.  Bleeding.  A stone may remain in the common bile duct.  A bile leak from the cyst duct that is clipped when your gallbladder is removed.  The need to convert to open surgery, which requires a larger incision in the abdomen. This may be necessary if  your surgeon thinks it is not safe to continue with a laparoscopic procedure. BEFORE THE PROCEDURE  Ask your health care provider about changing or stopping any regular medicines. You will need to stop taking aspirin or blood thinners at least 5 days prior to surgery.  Do not eat or drink anything after midnight the night before surgery.  Let your health care provider know if you develop a cold or other infectious problem before surgery. PROCEDURE   You will be given medicine to make you sleep  through the procedure (general anesthetic). A breathing tube will be placed in your mouth.  When you are asleep, your surgeon will make several small cuts (incisions) in your abdomen.  A thin, lighted tube with a tiny camera on the end (laparoscope) is inserted through one of the small incisions. The camera on the laparoscope sends a picture to a TV screen in the operating room. This gives the surgeon a good view inside your abdomen.  A gas will be pumped into your abdomen. This expands your abdomen so that the surgeon has more room to perform the surgery.  Other tools needed for the procedure are inserted through the other incisions. The gallbladder is removed through one of the incisions.  After the removal of your gallbladder, the incisions will be closed with stitches, staples, or skin glue. AFTER THE PROCEDURE  You will be taken to a recovery area where your progress will be checked often.  You may be allowed to go home the same day if your pain is controlled and you can tolerate liquids. Document Released: 10/20/2005 Document Revised: 08/10/2013 Document Reviewed: 06/01/2013 [facility] Patient Information 2015 [place], [place]. This information is not intended to replace advice given to you by your health care provider. Make sure you discuss any questions you have with your health care provider. PATIENT INSTRUCTIONS POST-ANESTHESIA  IMMEDIATELY FOLLOWING SURGERY:  Do not drive or operate machinery for the first twenty four hours after surgery.  Do not make any important decisions for twenty four hours after surgery or while taking narcotic pain medications or sedatives.  If you develop intractable nausea and vomiting or a severe headache please notify your doctor immediately.  FOLLOW-UP:  Please make an appointment with your surgeon as instructed. You do not need to follow up with anesthesia unless specifically instructed to do so.  WOUND CARE INSTRUCTIONS (if applicable):  Keep a dry  clean dressing on the anesthesia/puncture wound site if there is drainage.  Once the wound has quit draining you may leave it open to air.  Generally you should leave the bandage intact for twenty four hours unless there is drainage.  If the epidural site drains for more than 36-48 hours please call the anesthesia department.  QUESTIONS?:  Please feel free to call your physician or the hospital operator if you have any questions, and they will be happy to assist you.

## 2014-09-15 ENCOUNTER — Inpatient Hospital Stay (HOSPITAL_COMMUNITY)
Admission: RE | Admit: 2014-09-15 | Discharge: 2014-09-15 | Disposition: A | Discharge status: Home / Self Care | Payer: Self-pay | Source: Ambulatory Visit

## 2014-09-20 ENCOUNTER — Ambulatory Visit (HOSPITAL_COMMUNITY): Admission: RE | Admit: 2014-09-20 | Payer: Self-pay | Source: Ambulatory Visit | Admitting: General Surgery

## 2014-09-20 ENCOUNTER — Encounter (HOSPITAL_COMMUNITY): Admission: RE | Payer: Self-pay | Source: Ambulatory Visit

## 2014-09-20 SURGERY — LAPAROSCOPIC CHOLECYSTECTOMY
Anesthesia: General

## 2014-10-18 NOTE — H&P (Signed)
  NTS SOAP Note  Vital Signs:  Vitals as of: 10/18/2014: Systolic 125: Diastolic 80: Heart Rate 89: Temp 98.73F: Height 775ft 4in: Weight 191Lbs 0 Ounces: Pain Level 5: BMI 32.78  BMI : 32.78 kg/m2  Subjective: This 38 year old female presents for of right upper quadrant abdominal pain.  Has been having right upper quadrant abdominal pain,  nausea,  and fatty food intolerance for over one months.  Is getting worse.  No fever,  chills,  jaundice.  Pain radiates around right flank.  Has had several ER visits.  CT scan showed sludge,  U/S was unremarkable.     Review of Symptoms:  Constitutional:unremarkable   Head:unremarkable Eyes:unremarkable   Nose/Mouth/Throat:unremarkable Cardiovascular:  unremarkable Respiratory:unremarkable Gastrointestinal:    abdominal pain, nausea Genitourinary:unremarkable   Musculoskeletal:unremarkable Skin:unremarkable Hematolgic/Lymphatic:unremarkable   Allergic/Immunologic:unremarkable   Past Medical History:  Reviewed  Past Medical History  Surgical History: mandible surgery,  BTL Medical Problems: none Allergies: codeine  Medications: none   Social History:Reviewed  Social History  Preferred Language: English Race:  White Ethnicity: Not Hispanic / Latino Age: [age] year Marital Status:  M Alcohol: no   Smoking Status: Never smoker reviewed on 09/05/2014 Functional Status reviewed on 09/05/2014 ------------------------------------------------ Bathing: Normal Cooking: Normal Dressing: Normal Driving: Normal Eating: Normal Managing Meds: Normal Oral Care: Normal Shopping: Normal Toileting: Normal Transferring: Normal Walking: Normal Cognitive Status reviewed on 09/05/2014 ------------------------------------------------ Attention: Normal Decision Making: Normal Language: Normal Memory: Normal Motor: Normal Perception: Normal Problem Solving: Normal Visual and Spatial: Normal   Family  History:Reviewed  Family Health History Mother, Healthy;  Father, Healthy;     Objective Information: General:Well appearing, well nourished in no distress. Heart:RRR, no murmur or gallop.  Normal S1, S2.  No S3, S4.  Lungs:  CTA bilaterally, no wheezes, rhonchi, rales.  Breathing unlabored. Abdomen:Soft, tenderness in right upper quadrant to patlption,  ND, normal bowel sounds, no HSM, no masses.  No peritoneal signs.  Assessment:Chronic cholecystitis  Diagnoses: 575.11  K81.1 Chronic cholecystitis (Chronic cholecystitis)  Procedures: [id number] - OFFICE OUTPATIENT NEW 30 MINUTES    Plan:  Scheduled for laparoscopic cholecystectomy on 11/06/14.   Patient Education:Alternative treatments to surgery were discussed with patient (and family).  Risks and benefits  of procedure including bleeding,  infection,  hepatobiliary injury,  and the possibility of an open procedure were fully explained to the patient (and family) who gave informed consent. Patient/family questions were addressed.  Follow-up:Pending Surgery

## 2014-10-24 ENCOUNTER — Encounter (HOSPITAL_COMMUNITY): Payer: Self-pay

## 2014-10-24 ENCOUNTER — Emergency Department (HOSPITAL_COMMUNITY)
Admission: EM | Admit: 2014-10-24 | Discharge: 2014-10-24 | Disposition: A | Discharge status: Home / Self Care | Payer: Medicaid Other | Attending: Emergency Medicine | Admitting: Emergency Medicine

## 2014-10-24 DIAGNOSIS — Z880 Allergy status to penicillin: Secondary | ICD-10-CM | POA: Diagnosis not present

## 2014-10-24 DIAGNOSIS — R1011 Right upper quadrant pain: Secondary | ICD-10-CM | POA: Insufficient documentation

## 2014-10-24 DIAGNOSIS — R109 Unspecified abdominal pain: Secondary | ICD-10-CM | POA: Diagnosis present

## 2014-10-24 DIAGNOSIS — Z9851 Tubal ligation status: Secondary | ICD-10-CM | POA: Insufficient documentation

## 2014-10-24 LAB — CBC WITH DIFFERENTIAL/PLATELET
Basophils Absolute: 0.1 10*3/uL (ref 0.0–0.1)
Basophils Relative: 1 % (ref 0–1)
Eosinophils Absolute: 0.3 10*3/uL (ref 0.0–0.7)
Eosinophils Relative: 4 % (ref 0–5)
HCT: 36.7 % (ref 36.0–46.0)
Hemoglobin: 12 g/dL (ref 12.0–15.0)
Lymphocytes Relative: 32 % (ref 12–46)
Lymphs Abs: 2.9 10*3/uL (ref 0.7–4.0)
MCH: 26.1 pg (ref 26.0–34.0)
MCHC: 32.7 g/dL (ref 30.0–36.0)
MCV: 80 fL (ref 78.0–100.0)
Monocytes Absolute: 0.6 10*3/uL (ref 0.1–1.0)
Monocytes Relative: 7 % (ref 3–12)
Neutro Abs: 5.1 10*3/uL (ref 1.7–7.7)
Neutrophils Relative %: 56 % (ref 43–77)
Platelets: 305 10*3/uL (ref 150–400)
RBC: 4.59 MIL/uL (ref 3.87–5.11)
RDW: 13.7 % (ref 11.5–15.5)
WBC: 9.1 10*3/uL (ref 4.0–10.5)

## 2014-10-24 LAB — COMPREHENSIVE METABOLIC PANEL
ALT: 31 U/L (ref 0–35)
AST: 32 U/L (ref 0–37)
Albumin: 4.1 g/dL (ref 3.5–5.2)
Alkaline Phosphatase: 69 U/L (ref 39–117)
Anion gap: 6 (ref 5–15)
BUN: 13 mg/dL (ref 6–23)
CO2: 26 mmol/L (ref 19–32)
Calcium: 9.6 mg/dL (ref 8.4–10.5)
Chloride: 105 mEq/L (ref 96–112)
Creatinine, Ser: 1.05 mg/dL (ref 0.50–1.10)
GFR calc Af Amer: 78 mL/min — ABNORMAL LOW (ref >90)
GFR calc non Af Amer: 67 mL/min — ABNORMAL LOW (ref >90)
Glucose, Bld: 96 mg/dL (ref 70–99)
Potassium: 3.6 mmol/L (ref 3.5–5.1)
Sodium: 137 mmol/L (ref 135–145)
Total Bilirubin: 0.4 mg/dL (ref 0.3–1.2)
Total Protein: 7.5 g/dL (ref 6.0–8.3)

## 2014-10-24 LAB — URINALYSIS, ROUTINE W REFLEX MICROSCOPIC
Bilirubin Urine: NEGATIVE
Glucose, UA: NEGATIVE mg/dL
Hgb urine dipstick: NEGATIVE
Ketones, ur: NEGATIVE mg/dL
Nitrite: NEGATIVE
Protein, ur: NEGATIVE mg/dL
Specific Gravity, Urine: 1.02 (ref 1.005–1.030)
Urobilinogen, UA: 1 mg/dL (ref 0.0–1.0)
pH: 6.5 (ref 5.0–8.0)

## 2014-10-24 LAB — URINE MICROSCOPIC-ADD ON

## 2014-10-24 LAB — LIPASE, BLOOD: Lipase: 36 U/L (ref 11–59)

## 2014-10-24 MED ORDER — ONDANSETRON HCL 4 MG/2ML IJ SOLN
4.0000 mg | Freq: Once | INTRAMUSCULAR | Status: AC
  Administered 2014-10-24: 4 mg via INTRAVENOUS
  Filled 2014-10-24: qty 2

## 2014-10-24 MED ORDER — ONDANSETRON HCL 4 MG PO TABS: 4.0000 mg | ORAL_TABLET | Freq: Three times a day (TID) | ORAL | Status: DC | PRN

## 2014-10-24 MED ORDER — FENTANYL CITRATE 0.05 MG/ML IJ SOLN
50.0000 ug | Freq: Once | INTRAMUSCULAR | Status: AC
  Administered 2014-10-24: 50 ug via INTRAVENOUS
  Filled 2014-10-24: qty 2

## 2014-10-24 MED ORDER — MORPHINE SULFATE 4 MG/ML IJ SOLN
4.0000 mg | Freq: Once | INTRAMUSCULAR | Status: AC
  Administered 2014-10-24: 4 mg via INTRAVENOUS
  Filled 2014-10-24: qty 1

## 2014-10-24 MED ORDER — HYDROCODONE-ACETAMINOPHEN 5-325 MG PO TABS: 1.0000 | ORAL_TABLET | ORAL | Status: DC | PRN

## 2014-10-24 MED ORDER — SODIUM CHLORIDE 0.9 % IV SOLN
INTRAVENOUS | Status: DC
  Administered 2014-10-24: 04:00:00 via INTRAVENOUS

## 2014-10-24 NOTE — ED Notes (Signed)
Pt reports known gallbladder problems, states she started hurting in her right side a few days ago, worsening overnight, nausea, no vomiting or diarrhea

## 2014-10-24 NOTE — Discharge Instructions (Signed)
Drink plenty of fluids. Avoid fried, spicy, or greasy foods. Take the medications if needed. Keep your appointment with Dr. Lovell SheehanJenkins to have your gallbladder surgery in January. Return to the ED if you get fever, uncontrolled vomiting or pain.   Biliary Colic  Biliary colic is a steady or irregular pain in the upper abdomen. It is usually under the right side of the rib cage. It happens when gallstones interfere with the normal flow of bile from the gallbladder. Bile is a liquid that helps to digest fats. Bile is made in the liver and stored in the gallbladder. When you eat a meal, bile passes from the gallbladder through the cystic duct and the common bile duct into the small intestine. There, it mixes with partially digested food. If a gallstone blocks either of these ducts, the normal flow of bile is blocked. The muscle cells in the bile duct contract forcefully to try to move the stone. This causes the pain of biliary colic.  SYMPTOMS   A person with biliary colic usually complains of pain in the upper abdomen. This pain can be:  In the center of the upper abdomen just below the breastbone.  In the upper-right part of the abdomen, near the gallbladder and liver.  Spread back toward the right shoulder blade.  Nausea and vomiting.  The pain usually occurs after eating.  Biliary colic is usually triggered by the digestive system's demand for bile. The demand for bile is high after fatty meals. Symptoms can also occur when a person who has been fasting suddenly eats a very large meal. Most episodes of biliary colic pass after 1 to 5 hours. After the most intense pain passes, your abdomen may continue to ache mildly for about 24 hours. DIAGNOSIS  After you describe your symptoms, your caregiver will perform a physical exam. He or she will pay attention to the upper right portion of your belly (abdomen). This is the area of your liver and gallbladder. An ultrasound will help your caregiver look for  gallstones. Specialized scans of the gallbladder may also be done. Blood tests may be done, especially if you have fever or if your pain persists. PREVENTION  Biliary colic can be prevented by controlling the risk factors for gallstones. Some of these risk factors, such as heredity, increasing age, and pregnancy are a normal part of life. Obesity and a high-fat diet are risk factors you can change through a healthy lifestyle. Women going through menopause who take hormone replacement therapy (estrogen) are also more likely to develop biliary colic. TREATMENT   Pain medication may be prescribed.  You may be encouraged to eat a fat-free diet.  If the first episode of biliary colic is severe, or episodes of colic keep retuning, surgery to remove the gallbladder (cholecystectomy) is usually recommended. This procedure can be done through small incisions using an instrument called a laparoscope. The procedure often requires a brief stay in the hospital. Some people can leave the hospital the same day. It is the most widely used treatment in people troubled by painful gallstones. It is effective and safe, with no complications in more than 90% of cases.  If surgery cannot be done, medication that dissolves gallstones may be used. This medication is expensive and can take months or years to work. Only small stones will dissolve.  Rarely, medication to dissolve gallstones is combined with a procedure called shock-wave lithotripsy. This procedure uses carefully aimed shock waves to break up gallstones. In many people treated  with this procedure, gallstones form again within a few years. PROGNOSIS  If gallstones block your cystic duct or common bile duct, you are at risk for repeated episodes of biliary colic. There is also a 25% chance that you will develop a gallbladder infection(acute cholecystitis), or some other complication of gallstones within 10 to 20 years. If you have surgery, schedule it at a time  that is convenient for you and at a time when you are not sick. HOME CARE INSTRUCTIONS   Drink plenty of clear fluids.  Avoid fatty, greasy or fried foods, or any foods that make your pain worse.  Take medications as directed. SEEK MEDICAL CARE IF:   You develop a fever over 100.5 F (38.1 C).  Your pain gets worse over time.  You develop nausea that prevents you from eating and drinking.  You develop vomiting. SEEK IMMEDIATE MEDICAL CARE IF:   You have continuous or severe belly (abdominal) pain which is not relieved with medications.  You develop nausea and vomiting which is not relieved with medications.  You have symptoms of biliary colic and you suddenly develop a fever and shaking chills. This may signal cholecystitis. Call your caregiver immediately.  You develop a yellow color to your skin or the white part of your eyes (jaundice). Document Released: 03/23/2006 Document Revised: 01/12/2012 Document Reviewed: 06/01/2008 [facility] Patient Information 2015 [place], [place]. This information is not intended to replace advice given to you by your health care provider. Make sure you discuss any questions you have with your health care provider.

## 2014-10-24 NOTE — ED Provider Notes (Signed)
CSN: [id number]     Arrival date & time 10/24/14  0211 History   First MD Initiated Contact with Patient 10/24/14 0309     Chief Complaint  Patient presents with  . Abdominal Pain     (Consider location/radiation/quality/duration/timing/severity/associated sxs/prior Treatment) HPI  Patient reports she's been having some intermittent abdominal pain and had testing done in October which showed she had a nonfunctioning gallbladder with sludge. She is scheduled to have her gallbladder removed on January 4 by Dr. Lovell Sheehan. She reports however she has been having pain that started on December 19, despite only eating bland food although sometimes she states she doesn't follow the diet. She states the pain is in her right upper quadrant and she describes it as a twisting pain underneath her rib cage. The pain radiates into her shoulder blade area like someone punching her. She has had nausea and has had vomiting twice today. She denies diarrhea or fever.  PCP [facility] Surgeon Dr Lovell Sheehan  History reviewed. No pertinent past medical history. Past Surgical History  Procedure Laterality Date  . Tubal ligation    . Mandible surgery     No family history on file. History  Substance Use Topics  . Smoking status: Never Smoker   . Smokeless tobacco: Not on file  . Alcohol Use: No   Stay at home mom  OB History    No data available     Review of Systems  All other systems reviewed and are negative.     Allergies  Codeine and Penicillins  Home Medications   Prior to Admission medications   Medication Sig Start Date End Date Taking? Authorizing Provider  ibuprofen (ADVIL,MOTRIN) 200 MG tablet Take 400 mg by mouth every 6 (six) hours as needed for moderate pain.   Yes Historical Provider, MD  HYDROcodone-acetaminophen (NORCO/VICODIN) 5-325 MG per tablet Take 1 tablet by mouth every 4 (four) hours as needed. Patient not taking: Reported on 09/07/2014 08/17/14    Rolland Porter, MD  ondansetron (ZOFRAN ODT) 4 MG disintegrating tablet Take 1 tablet (4 mg total) by mouth every 8 (eight) hours as needed for nausea. Patient not taking: Reported on 09/07/2014 08/17/14   Rolland Porter, MD  oxyCODONE-acetaminophen (PERCOCET/ROXICET) 5-325 MG per tablet Take 1-2 tablets by mouth every 6 (six) hours as needed for severe pain. Patient not taking: Reported on 09/07/2014 08/21/14   Dorthula Matas, PA-C  promethazine (PHENERGAN) 25 MG tablet Take 1 tablet (25 mg total) by mouth every 6 (six) hours as needed for nausea or vomiting. Patient not taking: Reported on 09/07/2014 08/21/14   Dorthula Matas, PA-C   BP 117/86 mmHg  Pulse 88  Temp(Src) 98.6 F (37 C) (Oral)  Resp 16  Ht 5\' 4"  (1.626 m)  Wt 188 lb (85.276 kg)  BMI 32.25 kg/m2  SpO2 100%  LMP 10/17/2014  Vital signs normal   Physical Exam  Constitutional: She is oriented to person, place, and time. She appears well-developed and well-nourished.  Non-toxic appearance. She does not appear ill. No distress.  HENT:  Head: Normocephalic and atraumatic.  Right Ear: External ear normal.  Left Ear: External ear normal.  Nose: Nose normal. No mucosal edema or rhinorrhea.  Mouth/Throat: Oropharynx is clear and moist and mucous membranes are normal. No dental abscesses or uvula swelling.  Eyes: Conjunctivae and EOM are normal. Pupils are equal, round, and reactive to light.  Neck: Normal range of motion and full passive range of motion without pain.  Neck supple.  Cardiovascular: Normal rate, regular rhythm and normal heart sounds.  Exam reveals no gallop and no friction rub.   No murmur heard. Pulmonary/Chest: Effort normal and breath sounds normal. No respiratory distress. She has no wheezes. She has no rhonchi. She has no rales. She exhibits no tenderness and no crepitus.  Abdominal: Soft. Normal appearance and bowel sounds are normal. She exhibits no distension. There is tenderness. There is no rebound and no  guarding.    Musculoskeletal: Normal range of motion. She exhibits no edema or tenderness.  Moves all extremities well.   Neurological: She is alert and oriented to person, place, and time. She has normal strength. No cranial nerve deficit.  Skin: Skin is warm, dry and intact. No rash noted. No erythema. No pallor.  Psychiatric: She has a normal mood and affect. Her speech is normal and behavior is normal. Her mood appears not anxious.  Nursing note and vitals reviewed.   ED Course  Procedures (including critical care time)  Medications  0.9 %  sodium chloride infusion ( Intravenous New Bag/Given 10/24/14 0350)  morphine 4 MG/ML injection 4 mg (4 mg Intravenous Given 10/24/14 0350)  ondansetron (ZOFRAN) injection 4 mg (4 mg Intravenous Given 10/24/14 0350)  fentaNYL (SUBLIMAZE) injection 50 mcg (50 mcg Intravenous Given 10/24/14 0450)   Recheck at 04 40 patient states she still having discomfort. More medications were ordered.  Recheck at discharge patient states her pain is almost gone and she feels ready for discharge.   Labs Review Results for orders placed or performed during the hospital encounter of 10/24/14  Comprehensive metabolic panel  Result Value Ref Range   Sodium 137 135 - 145 mmol/L   Potassium 3.6 3.5 - 5.1 mmol/L   Chloride 105 96 - 112 mEq/L   CO2 26 19 - 32 mmol/L   Glucose, Bld 96 70 - 99 mg/dL   BUN 13 6 - 23 mg/dL   Creatinine, Ser 4.091.05 0.50 - 1.10 mg/dL   Calcium 9.6 8.4 - 81.110.5 mg/dL   Total Protein 7.5 6.0 - 8.3 g/dL   Albumin 4.1 3.5 - 5.2 g/dL   AST 32 0 - 37 U/L   ALT 31 0 - 35 U/L   Alkaline Phosphatase 69 39 - 117 U/L   Total Bilirubin 0.4 0.3 - 1.2 mg/dL   GFR calc non Af Amer 67 (L) >90 mL/min   GFR calc Af Amer 78 (L) >90 mL/min   Anion gap 6 5 - 15  CBC with Differential  Result Value Ref Range   WBC 9.1 4.0 - 10.5 K/uL   RBC 4.59 3.87 - 5.11 MIL/uL   Hemoglobin 12.0 12.0 - 15.0 g/dL   HCT 91.436.7 78.236.0 - 95.646.0 %   MCV 80.0 78.0 - 100.0  fL   MCH 26.1 26.0 - 34.0 pg   MCHC 32.7 30.0 - 36.0 g/dL   RDW 21.313.7 08.611.5 - 57.815.5 %   Platelets 305 150 - 400 K/uL   Neutrophils Relative % 56 43 - 77 %   Neutro Abs 5.1 1.7 - 7.7 K/uL   Lymphocytes Relative 32 12 - 46 %   Lymphs Abs 2.9 0.7 - 4.0 K/uL   Monocytes Relative 7 3 - 12 %   Monocytes Absolute 0.6 0.1 - 1.0 K/uL   Eosinophils Relative 4 0 - 5 %   Eosinophils Absolute 0.3 0.0 - 0.7 K/uL   Basophils Relative 1 0 - 1 %   Basophils Absolute 0.1 0.0 -  0.1 K/uL  Lipase, blood  Result Value Ref Range   Lipase 36 11 - 59 U/L  Urinalysis, Routine w reflex microscopic  Result Value Ref Range   Color, Urine YELLOW YELLOW   APPearance CLEAR CLEAR   Specific Gravity, Urine 1.020 1.005 - 1.030   pH 6.5 5.0 - 8.0   Glucose, UA NEGATIVE NEGATIVE mg/dL   Hgb urine dipstick NEGATIVE NEGATIVE   Bilirubin Urine NEGATIVE NEGATIVE   Ketones, ur NEGATIVE NEGATIVE mg/dL   Protein, ur NEGATIVE NEGATIVE mg/dL   Urobilinogen, UA 1.0 0.0 - 1.0 mg/dL   Nitrite NEGATIVE NEGATIVE   Leukocytes, UA TRACE (A) NEGATIVE  Urine microscopic-add on  Result Value Ref Range   Squamous Epithelial / LPF MANY (A) RARE   WBC, UA 0-2 <3 WBC/hpf   Bacteria, UA MANY (A) RARE   Laboratory interpretation all normal    Imaging Review No results found.    08/21/2014 CLINICAL DATA: 38 year old female with right upper quadrant pain, nausea and vomiting for 2 weeks getting worse since 08/16/2014. Subsequent encounter.  EXAM: US ABDOMEN LIMITED - RIGHT UPPER QUADRANT  COMPARISON: None.   IMPRESSION: Gallbladder is full. No gallstones or sludge detected. No pericholecystic fluid or gallbladder wall thickening. Per ultrasound technologist, the patient was not tender over this region during scanning.  Mild increased echogenicity of the liver may represent fatty infiltration. No focal mass or intrahepatic biliary duct dilation.   Electronically Signed  By: Bridgett LarssonSteve Olson M.D.  On:  08/21/2014 16:01    08/17/2014 CLINICAL DATA: Right upper quadrant abdominal pain for several days. Initial encounter.  EXAM: ULTRASOUND ABDOMEN COMPLETE  COMPARISON: CT abdomen pelvis -02/09/2010  IMPRESSION: Echogenic sludge within otherwise normal-appearing gallbladder. Otherwise, no explanation for patient's right upper quadrant abdominal pain. If concern persists for acute cholecystitis, further evaluation could be performed with a HIDA scan as clinically indicated.   Electronically Signed  By: Simonne ComeJohn Watts M.D.  On: 08/17/2014 00:03   EKG Interpretation None      MDM   Final diagnoses:  RUQ pain    New Prescriptions   HYDROCODONE-ACETAMINOPHEN (NORCO/VICODIN) 5-325 MG PER TABLET    Take 1 tablet by mouth every 4 (four) hours as needed for moderate pain.   ONDANSETRON (ZOFRAN) 4 MG TABLET    Take 1 tablet (4 mg total) by mouth every 8 (eight) hours as needed.    Plan discharge  Devoria AlbeIva Knapp, MD, Franz DellFACEP     Iva L Knapp, MD 10/24/14 [phone]

## 2014-10-31 NOTE — Patient Instructions (Addendum)
Susan Wheeler  11/01/2014   Your procedure is scheduled on:  11/06/2014  [facility].  Call this number if you have problems the morning of surgery: [phone]   Remember:   Do not eat food or drink liquids after midnight.   Take these medicines the morning of surgery with A SIP OF WATER: Hydrocodone, Zofran (If needed)   Do not wear jewelry, make-up or nail polish.  Do not wear lotions, powders, or perfumes. You may wear deodorant.  Do not shave 48 hours prior to surgery. Men may shave face and neck.  Do not bring valuables to the hospital.  [facility] any belongings or valuables.               Contacts, dentures or bridgework may not be worn into surgery.  Leave suitcase in the car. After surgery it may be brought to your room.  For patients admitted to the hospital, discharge time is determined by your treatment team.               Patients discharged the day of surgery will not be allowed to drive home.  Name and phone number of your driver:   Special Instructions: Shower using CHG 2 nights before surgery and the night before surgery.  If you shower the day of surgery use CHG.  Use special wash - you have one bottle of CHG for all showers.  You should use approximately 1/3 of the bottle for each shower.   Please read over the following fact sheets that you were given: Surgical Site Infection Prevention and Anesthesia Post-op Instructions   PATIENT INSTRUCTIONS POST-ANESTHESIA  IMMEDIATELY FOLLOWING SURGERY:  Do not drive or operate machinery for the first twenty four hours after surgery.  Do not make any important decisions for twenty four hours after surgery or while taking narcotic pain medications or sedatives.  If you develop intractable nausea and vomiting or a severe headache please notify your doctor immediately.  FOLLOW-UP:  Please make an appointment with your surgeon as instructed. You do not need to follow up with  anesthesia unless specifically instructed to do so.  WOUND CARE INSTRUCTIONS (if applicable):  Keep a dry clean dressing on the anesthesia/puncture wound site if there is drainage.  Once the wound has quit draining you may leave it open to air.  Generally you should leave the bandage intact for twenty four hours unless there is drainage.  If the epidural site drains for more than 36-48 hours please call the anesthesia department.  QUESTIONS?:  Please feel free to call your physician or the hospital operator if you have any questions, and they will be happy to assist you.      Laparoscopic Cholecystectomy Laparoscopic cholecystectomy is surgery to remove the gallbladder. The gallbladder is located in the upper right part of the abdomen, behind the liver. It is a storage sac for bile produced in the liver. Bile aids in the digestion and absorption of fats. Cholecystectomy is often done for inflammation of the gallbladder (cholecystitis). This condition is usually caused by a buildup of gallstones (cholelithiasis) in your gallbladder. Gallstones can block the flow of bile, resulting in inflammation and pain. In severe cases, emergency surgery may be required. When emergency surgery is not required, you will have time to prepare for the procedure. Laparoscopic surgery is an alternative to open surgery. Laparoscopic surgery has a shorter recovery time. Your common bile duct may also need  to be examined during the procedure. If stones are found in the common bile duct, they may be removed. LET [facility] KNOW ABOUT:  Any allergies you have.  All medicines you are taking, including vitamins, herbs, eye drops, creams, and over-the-counter medicines.  Previous problems you or members of your family have had with the use of anesthetics.  Any blood disorders you have.  Previous surgeries you have had.  Medical conditions you have. RISKS AND COMPLICATIONS Generally, this is a safe  procedure. However, as with any procedure, complications can occur. Possible complications include:  Infection.  Damage to the common bile duct, nerves, arteries, veins, or other internal organs such as the stomach, liver, or intestines.  Bleeding.  A stone may remain in the common bile duct.  A bile leak from the cyst duct that is clipped when your gallbladder is removed.  The need to convert to open surgery, which requires a larger incision in the abdomen. This may be necessary if your surgeon thinks it is not safe to continue with a laparoscopic procedure. BEFORE THE PROCEDURE  Ask your health care provider about changing or stopping any regular medicines. You will need to stop taking aspirin or blood thinners at least 5 days prior to surgery.  Do not eat or drink anything after midnight the night before surgery.  Let your health care provider know if you develop a cold or other infectious problem before surgery. PROCEDURE   You will be given medicine to make you sleep through the procedure (general anesthetic). A breathing tube will be placed in your mouth.  When you are asleep, your surgeon will make several small cuts (incisions) in your abdomen.  A thin, lighted tube with a tiny camera on the end (laparoscope) is inserted through one of the small incisions. The camera on the laparoscope sends a picture to a TV screen in the operating room. This gives the surgeon a good view inside your abdomen.  A gas will be pumped into your abdomen. This expands your abdomen so that the surgeon has more room to perform the surgery.  Other tools needed for the procedure are inserted through the other incisions. The gallbladder is removed through one of the incisions.  After the removal of your gallbladder, the incisions will be closed with stitches, staples, or skin glue. AFTER THE PROCEDURE  You will be taken to a recovery area where your progress will be checked often.  You may be  allowed to go home the same day if your pain is controlled and you can tolerate liquids. Document Released: 10/20/2005 Document Revised: 08/10/2013 Document Reviewed: 06/01/2013 [facility] Patient Information 2015 [place]. This information is not intended to replace advice given to you by your health care provider. Make sure you discuss any questions you have with your health care provider.

## 2014-11-01 ENCOUNTER — Encounter (HOSPITAL_COMMUNITY)
Admission: RE | Admit: 2014-11-01 | Discharge: 2014-11-01 | Disposition: A | Discharge status: Home / Self Care | Payer: Medicaid Other | Source: Ambulatory Visit | Attending: General Surgery | Admitting: General Surgery

## 2014-11-01 ENCOUNTER — Encounter (HOSPITAL_COMMUNITY): Payer: Self-pay

## 2014-11-01 DIAGNOSIS — Z01812 Encounter for preprocedural laboratory examination: Secondary | ICD-10-CM | POA: Diagnosis not present

## 2014-11-01 HISTORY — DX: Anxiety disorder, unspecified: F41.9

## 2014-11-01 HISTORY — DX: Gastro-esophageal reflux disease without esophagitis: K21.9

## 2014-11-01 LAB — CBC WITH DIFFERENTIAL/PLATELET
Basophils Absolute: 0.1 10*3/uL (ref 0.0–0.1)
Basophils Relative: 1 % (ref 0–1)
Eosinophils Absolute: 0.4 10*3/uL (ref 0.0–0.7)
Eosinophils Relative: 4 % (ref 0–5)
HCT: 36.7 % (ref 36.0–46.0)
Hemoglobin: 11.9 g/dL — ABNORMAL LOW (ref 12.0–15.0)
Lymphocytes Relative: 24 % (ref 12–46)
Lymphs Abs: 2.2 10*3/uL (ref 0.7–4.0)
MCH: 26 pg (ref 26.0–34.0)
MCHC: 32.4 g/dL (ref 30.0–36.0)
MCV: 80.3 fL (ref 78.0–100.0)
Monocytes Absolute: 0.6 10*3/uL (ref 0.1–1.0)
Monocytes Relative: 6 % (ref 3–12)
Neutro Abs: 6.2 10*3/uL (ref 1.7–7.7)
Neutrophils Relative %: 65 % (ref 43–77)
Platelets: 313 10*3/uL (ref 150–400)
RBC: 4.57 MIL/uL (ref 3.87–5.11)
RDW: 13.9 % (ref 11.5–15.5)
WBC: 9.5 10*3/uL (ref 4.0–10.5)

## 2014-11-01 LAB — BASIC METABOLIC PANEL
Anion gap: 6 (ref 5–15)
BUN: 10 mg/dL (ref 6–23)
CO2: 26 mmol/L (ref 19–32)
Calcium: 9.4 mg/dL (ref 8.4–10.5)
Chloride: 105 mEq/L (ref 96–112)
Creatinine, Ser: 0.84 mg/dL (ref 0.50–1.10)
GFR calc Af Amer: >90 mL/min (ref >90)
GFR calc non Af Amer: 88 mL/min — ABNORMAL LOW (ref >90)
Glucose, Bld: 89 mg/dL (ref 70–99)
Potassium: 3.9 mmol/L (ref 3.5–5.1)
Sodium: 137 mmol/L (ref 135–145)

## 2014-11-01 LAB — HCG, SERUM, QUALITATIVE: Preg, Serum: NEGATIVE

## 2014-11-01 LAB — HEPATIC FUNCTION PANEL
ALT: 21 U/L (ref 0–35)
AST: 26 U/L (ref 0–37)
Albumin: 4.1 g/dL (ref 3.5–5.2)
Alkaline Phosphatase: 64 U/L (ref 39–117)
Bilirubin, Direct: 0.1 mg/dL (ref 0.0–0.3)
Indirect Bilirubin: 0.4 mg/dL (ref 0.3–0.9)
Total Bilirubin: 0.5 mg/dL (ref 0.3–1.2)
Total Protein: 7.1 g/dL (ref 6.0–8.3)

## 2014-11-06 ENCOUNTER — Ambulatory Visit (HOSPITAL_COMMUNITY): Payer: Medicaid Other | Admitting: Anesthesiology

## 2014-11-06 ENCOUNTER — Encounter (HOSPITAL_COMMUNITY): Payer: Self-pay | Admitting: *Deleted

## 2014-11-06 ENCOUNTER — Encounter (HOSPITAL_COMMUNITY)
Admission: RE | Disposition: A | Discharge status: Home / Self Care | Payer: Self-pay | Source: Ambulatory Visit | Attending: General Surgery

## 2014-11-06 ENCOUNTER — Ambulatory Visit (HOSPITAL_COMMUNITY)
Admission: RE | Admit: 2014-11-06 | Discharge: 2014-11-06 | Disposition: A | Discharge status: Home / Self Care | Payer: Medicaid Other | Source: Ambulatory Visit | Attending: General Surgery | Admitting: General Surgery

## 2014-11-06 DIAGNOSIS — R1011 Right upper quadrant pain: Secondary | ICD-10-CM | POA: Diagnosis present

## 2014-11-06 DIAGNOSIS — K811 Chronic cholecystitis: Secondary | ICD-10-CM | POA: Diagnosis not present

## 2014-11-06 DIAGNOSIS — K219 Gastro-esophageal reflux disease without esophagitis: Secondary | ICD-10-CM | POA: Insufficient documentation

## 2014-11-06 HISTORY — PX: CHOLECYSTECTOMY: SHX55

## 2014-11-06 SURGERY — LAPAROSCOPIC CHOLECYSTECTOMY
Anesthesia: General

## 2014-11-06 MED ORDER — POVIDONE-IODINE 10 % OINT PACKET
TOPICAL_OINTMENT | CUTANEOUS | Status: DC | PRN
  Administered 2014-11-06: 1 via TOPICAL

## 2014-11-06 MED ORDER — SODIUM CHLORIDE 0.9 % IR SOLN
Status: DC | PRN
  Administered 2014-11-06: 1000 mL

## 2014-11-06 MED ORDER — ONDANSETRON HCL 4 MG/2ML IJ SOLN
INTRAMUSCULAR | Status: AC
  Filled 2014-11-06: qty 2

## 2014-11-06 MED ORDER — FENTANYL CITRATE 0.05 MG/ML IJ SOLN
25.0000 ug | INTRAMUSCULAR | Status: DC | PRN
  Administered 2014-11-06 (×4): 50 ug via INTRAVENOUS

## 2014-11-06 MED ORDER — MIDAZOLAM HCL 2 MG/2ML IJ SOLN
1.0000 mg | INTRAMUSCULAR | Status: DC | PRN
  Administered 2014-11-06: 2 mg via INTRAVENOUS

## 2014-11-06 MED ORDER — DEXAMETHASONE SODIUM PHOSPHATE 4 MG/ML IJ SOLN
INTRAMUSCULAR | Status: AC
  Filled 2014-11-06: qty 1

## 2014-11-06 MED ORDER — ONDANSETRON HCL 4 MG/2ML IJ SOLN
4.0000 mg | Freq: Once | INTRAMUSCULAR | Status: AC | PRN
  Administered 2014-11-06: 4 mg via INTRAVENOUS

## 2014-11-06 MED ORDER — BUPIVACAINE HCL (PF) 0.5 % IJ SOLN
INTRAMUSCULAR | Status: AC
  Filled 2014-11-06: qty 30

## 2014-11-06 MED ORDER — FENTANYL CITRATE 0.05 MG/ML IJ SOLN
INTRAMUSCULAR | Status: AC
  Filled 2014-11-06: qty 5

## 2014-11-06 MED ORDER — CIPROFLOXACIN IN D5W 400 MG/200ML IV SOLN
INTRAVENOUS | Status: AC
  Filled 2014-11-06: qty 200

## 2014-11-06 MED ORDER — FENTANYL CITRATE 0.05 MG/ML IJ SOLN
INTRAMUSCULAR | Status: DC | PRN
Start: 2014-11-06 — End: 2014-11-06
  Administered 2014-11-06: 50 ug via INTRAVENOUS
  Administered 2014-11-06: 100 ug via INTRAVENOUS
  Administered 2014-11-06 (×2): 50 ug via INTRAVENOUS

## 2014-11-06 MED ORDER — LIDOCAINE HCL (PF) 1 % IJ SOLN
INTRAMUSCULAR | Status: AC
  Filled 2014-11-06: qty 5

## 2014-11-06 MED ORDER — ARTIFICIAL TEARS OP OINT
TOPICAL_OINTMENT | OPHTHALMIC | Status: AC
Start: 2014-11-06 — End: 2014-11-06
  Filled 2014-11-06: qty 3.5

## 2014-11-06 MED ORDER — HEMOSTATIC AGENTS (NO CHARGE) OPTIME
TOPICAL | Status: DC | PRN
  Administered 2014-11-06: 1 via TOPICAL

## 2014-11-06 MED ORDER — FENTANYL CITRATE 0.05 MG/ML IJ SOLN
INTRAMUSCULAR | Status: AC
  Filled 2014-11-06: qty 2

## 2014-11-06 MED ORDER — OXYCODONE-ACETAMINOPHEN 7.5-325 MG PO TABS: 1.0000 | ORAL_TABLET | ORAL | Status: DC | PRN

## 2014-11-06 MED ORDER — SUCCINYLCHOLINE CHLORIDE 20 MG/ML IJ SOLN
INTRAMUSCULAR | Status: DC | PRN
  Administered 2014-11-06: 120 mg via INTRAVENOUS

## 2014-11-06 MED ORDER — ONDANSETRON HCL 4 MG/2ML IJ SOLN
4.0000 mg | Freq: Once | INTRAMUSCULAR | Status: AC
  Administered 2014-11-06: 4 mg via INTRAVENOUS

## 2014-11-06 MED ORDER — NEOSTIGMINE METHYLSULFATE 10 MG/10ML IV SOLN
INTRAVENOUS | Status: DC | PRN
  Administered 2014-11-06: 4 mg via INTRAVENOUS

## 2014-11-06 MED ORDER — CHLORHEXIDINE GLUCONATE 4 % EX LIQD: 1.0000 "application " | Freq: Once | CUTANEOUS | Status: DC

## 2014-11-06 MED ORDER — BUPIVACAINE HCL (PF) 0.5 % IJ SOLN
INTRAMUSCULAR | Status: DC | PRN
  Administered 2014-11-06: 10 mL

## 2014-11-06 MED ORDER — PROPOFOL 10 MG/ML IV EMUL
INTRAVENOUS | Status: AC
  Filled 2014-11-06: qty 20

## 2014-11-06 MED ORDER — GLYCOPYRROLATE 0.2 MG/ML IJ SOLN
INTRAMUSCULAR | Status: AC
  Filled 2014-11-06: qty 1

## 2014-11-06 MED ORDER — GLYCOPYRROLATE 0.2 MG/ML IJ SOLN
INTRAMUSCULAR | Status: DC | PRN
  Administered 2014-11-06: 0.6 mg via INTRAVENOUS

## 2014-11-06 MED ORDER — ROCURONIUM BROMIDE 50 MG/5ML IV SOLN
INTRAVENOUS | Status: AC
  Filled 2014-11-06: qty 1

## 2014-11-06 MED ORDER — POVIDONE-IODINE 10 % EX OINT
TOPICAL_OINTMENT | CUTANEOUS | Status: AC
  Filled 2014-11-06: qty 1

## 2014-11-06 MED ORDER — DEXAMETHASONE SODIUM PHOSPHATE 4 MG/ML IJ SOLN
4.0000 mg | Freq: Once | INTRAMUSCULAR | Status: AC
  Administered 2014-11-06: 4 mg via INTRAVENOUS

## 2014-11-06 MED ORDER — PROPOFOL 10 MG/ML IV BOLUS
INTRAVENOUS | Status: DC | PRN
  Administered 2014-11-06: 150 mg via INTRAVENOUS

## 2014-11-06 MED ORDER — LIDOCAINE HCL (CARDIAC) 20 MG/ML IV SOLN
INTRAVENOUS | Status: DC | PRN
  Administered 2014-11-06: 50 mg via INTRAVENOUS

## 2014-11-06 MED ORDER — GLYCOPYRROLATE 0.2 MG/ML IJ SOLN
INTRAMUSCULAR | Status: AC
  Filled 2014-11-06: qty 6

## 2014-11-06 MED ORDER — KETOROLAC TROMETHAMINE 30 MG/ML IJ SOLN
30.0000 mg | Freq: Once | INTRAMUSCULAR | Status: AC
  Administered 2014-11-06: 30 mg via INTRAVENOUS
  Filled 2014-11-06: qty 1

## 2014-11-06 MED ORDER — MIDAZOLAM HCL 2 MG/2ML IJ SOLN
INTRAMUSCULAR | Status: AC
  Filled 2014-11-06: qty 2

## 2014-11-06 MED ORDER — ROCURONIUM BROMIDE 100 MG/10ML IV SOLN
INTRAVENOUS | Status: DC | PRN
  Administered 2014-11-06: 15 mg via INTRAVENOUS
  Administered 2014-11-06: 5 mg via INTRAVENOUS
  Administered 2014-11-06: 10 mg via INTRAVENOUS

## 2014-11-06 MED ORDER — LACTATED RINGERS IV SOLN
INTRAVENOUS | Status: DC | PRN
  Administered 2014-11-06 (×2): via INTRAVENOUS

## 2014-11-06 MED ORDER — LACTATED RINGERS IV SOLN
INTRAVENOUS | Status: DC
  Administered 2014-11-06: 08:00:00 via INTRAVENOUS

## 2014-11-06 MED ORDER — CIPROFLOXACIN IN D5W 400 MG/200ML IV SOLN
400.0000 mg | INTRAVENOUS | Status: AC
  Administered 2014-11-06: 400 mg via INTRAVENOUS

## 2014-11-06 SURGICAL SUPPLY — 46 items
APPLIER CLIP LAPSCP 10X32 DD (CLIP) ×2 IMPLANT
BAG HAMPER (MISCELLANEOUS) ×2 IMPLANT
BAG SPEC RTRVL LRG 6X4 10 (ENDOMECHANICALS) ×1
BLADE 11 SAFETY STRL DISP (BLADE) ×1 IMPLANT
CHLORAPREP W/TINT 26ML (MISCELLANEOUS) ×2 IMPLANT
CLOTH BEACON ORANGE TIMEOUT ST (SAFETY) ×2 IMPLANT
COVER LIGHT HANDLE STERIS (MISCELLANEOUS) ×4 IMPLANT
DECANTER SPIKE VIAL GLASS SM (MISCELLANEOUS) ×2 IMPLANT
ELECT REM PT RETURN 9FT ADLT (ELECTROSURGICAL) ×2
ELECTRODE REM PT RTRN 9FT ADLT (ELECTROSURGICAL) ×1 IMPLANT
FILTER SMOKE EVAC LAPAROSHD (FILTER) ×2 IMPLANT
FORMALIN 10 PREFIL 120ML (MISCELLANEOUS) ×2 IMPLANT
GLOVE BIOGEL PI IND STRL 7.0 (GLOVE) IMPLANT
GLOVE BIOGEL PI IND STRL 7.5 (GLOVE) IMPLANT
GLOVE BIOGEL PI INDICATOR 7.0 (GLOVE) ×1
GLOVE BIOGEL PI INDICATOR 7.5 (GLOVE) ×2
GLOVE ECLIPSE 6.5 STRL STRAW (GLOVE) ×1 IMPLANT
GLOVE ECLIPSE 7.0 STRL STRAW (GLOVE) ×1 IMPLANT
GLOVE SURG SS PI 7.5 STRL IVOR (GLOVE) ×2 IMPLANT
GOWN STRL REUS W/ TWL XL LVL3 (GOWN DISPOSABLE) ×1 IMPLANT
GOWN STRL REUS W/TWL LRG LVL3 (GOWN DISPOSABLE) ×4 IMPLANT
GOWN STRL REUS W/TWL XL LVL3 (GOWN DISPOSABLE) ×2
HEMOSTAT SNOW SURGICEL 2X4 (HEMOSTASIS) ×2 IMPLANT
INST SET LAPROSCOPIC AP (KITS) ×2 IMPLANT
IV NS IRRIG 3000ML ARTHROMATIC (IV SOLUTION) IMPLANT
KIT ROOM TURNOVER APOR (KITS) ×2 IMPLANT
MANIFOLD NEPTUNE II (INSTRUMENTS) ×2 IMPLANT
NDL INSUFFLATION 14GA 120MM (NEEDLE) ×1 IMPLANT
NEEDLE INSUFFLATION 14GA 120MM (NEEDLE) ×2 IMPLANT
NS IRRIG 1000ML POUR BTL (IV SOLUTION) ×2 IMPLANT
PACK LAP CHOLE LZT030E (CUSTOM PROCEDURE TRAY) ×2 IMPLANT
PAD ARMBOARD 7.5X6 YLW CONV (MISCELLANEOUS) ×2 IMPLANT
POUCH SPECIMEN RETRIEVAL 10MM (ENDOMECHANICALS) ×2 IMPLANT
SET BASIN LINEN APH (SET/KITS/TRAYS/PACK) ×2 IMPLANT
SET TUBE IRRIG SUCTION NO TIP (IRRIGATION / IRRIGATOR) IMPLANT
SLEEVE ENDOPATH XCEL 5M (ENDOMECHANICALS) ×2 IMPLANT
SPONGE GAUZE 2X2 8PLY STRL LF (GAUZE/BANDAGES/DRESSINGS) ×8 IMPLANT
STAPLER VISISTAT (STAPLE) ×2 IMPLANT
SUT VICRYL 0 UR6 27IN ABS (SUTURE) ×2 IMPLANT
TAPE CLOTH SURG 4X10 WHT LF (GAUZE/BANDAGES/DRESSINGS) ×1 IMPLANT
TROCAR ENDO BLADELESS 11MM (ENDOMECHANICALS) ×2 IMPLANT
TROCAR XCEL NON-BLD 5MMX100MML (ENDOMECHANICALS) ×2 IMPLANT
TROCAR XCEL UNIV SLVE 11M 100M (ENDOMECHANICALS) ×2 IMPLANT
TUBING INSUFFLATION (TUBING) ×2 IMPLANT
WARMER LAPAROSCOPE (MISCELLANEOUS) ×2 IMPLANT
YANKAUER SUCT 12FT TUBE ARGYLE (SUCTIONS) ×2 IMPLANT

## 2014-11-06 NOTE — Transfer of Care (Signed)
Immediate Anesthesia Transfer of Care Note  Patient: Susan Wheeler  Procedure(s) Performed: Procedure(s): LAPAROSCOPIC CHOLECYSTECTOMY (N/A)  Patient Location: PACU  Anesthesia Type:General  Level of Consciousness: sedated and patient cooperative  Airway & Oxygen Therapy: Patient Spontanous Breathing and Patient connected to face mask oxygen  Post-op Assessment: Report given to PACU RN and Post -op Vital signs reviewed and stable  Post vital signs: Reviewed and stable  Complications: No apparent anesthesia complications

## 2014-11-06 NOTE — Anesthesia Preprocedure Evaluation (Signed)
Anesthesia Evaluation  Patient identified by MRN, date of birth, ID band  Reviewed: Allergy & Precautions, H&P , NPO status , Patient's Chart, lab work & pertinent test results  Airway Mallampati: I  TM Distance: >3 FB     Dental  (+) Teeth Intact   Pulmonary neg pulmonary ROS,  breath sounds clear to auscultation        Cardiovascular negative cardio ROS  Rhythm:Regular Rate:Normal     Neuro/Psych PSYCHIATRIC DISORDERS Anxiety    GI/Hepatic GERD-  Medicated,  Endo/Other    Renal/GU      Musculoskeletal   Abdominal   Peds  Hematology   Anesthesia Other Findings   Reproductive/Obstetrics                             Anesthesia Physical Anesthesia Plan  ASA: II  Anesthesia Plan: General   Post-op Pain Management:    Induction: Intravenous, Rapid sequence and Cricoid pressure planned  Airway Management Planned: Oral ETT  Additional Equipment:   Intra-op Plan:   Post-operative Plan: Extubation in OR  Informed Consent: I have reviewed the patients History and Physical, chart, labs and discussed the procedure including the risks, benefits and alternatives for the proposed anesthesia with the patient or authorized representative who has indicated his/her understanding and acceptance.     Plan Discussed with:   Anesthesia Plan Comments:         Anesthesia Quick Evaluation

## 2014-11-06 NOTE — Anesthesia Postprocedure Evaluation (Signed)
  Anesthesia Post-op Note Late Entry Patient: Susan Wheeler  Procedure(s) Performed: Procedure(s): LAPAROSCOPIC CHOLECYSTECTOMY (N/A)  Patient Location: PACU  Anesthesia Type:MAC  Level of Consciousness: awake, alert , oriented and patient cooperative  Airway and Oxygen Therapy: Patient Spontanous Breathing  Post-op Pain: none  Post-op Assessment: Post-op Vital signs reviewed, Patient's Cardiovascular Status Stable, Respiratory Function Stable, No signs of Nausea or vomiting and Pain level controlled  Post-op Vital Signs: Reviewed and stable  Last Vitals:  Filed Vitals:   11/06/14 1030  BP: 99/62  Pulse: 81  Temp:   Resp: 15    Complications: No apparent anesthesia complications

## 2014-11-06 NOTE — Op Note (Signed)
Patient:  Susan Wheeler  DOB:  [birth year]  MRN:  [id number]   Preop Diagnosis:  Chronic cholecystitis  Postop Diagnosis:  Same  Procedure:  Laparoscopic cholecystectomy  Surgeon:  Franky Macho, M.D.  Anes:  Gen. endotracheal  Indications:  Patient is a 39 year old white female presents with cholecystitis secondary to biliary sludge and chronic cholecystitis. The risks and benefits of the procedure including bleeding, infection, hepatobiliary injury, and the possibility of an open procedure were fully explained to the patient, who gave informed consent.  Procedure note:  The patient is placed the supine position. After induction of general endotracheal anesthesia, the abdomen was prepped and draped using usual sterile technique with DuraPrep. Surgical site confirmation was performed.  A supraumbilical incision was made down to the fascia. A Veress needle was introduced into the abdominal cavity and confirmation of placement was done using the saline drop test. The abdomen was then insufflated to 16 mmHg pressure. An 11 mm trocar was introduced into the abdominal cavity under direct visualization without difficulty. The patient was placed in reverse Trendelenburg position and additional 11 mm trocar was placed the epigastric region and 5 mm trochars were placed right upper quadrant and right flank regions. The liver was inspected and noted within normal limits. The gallbladder had some adhesions around it, which were freed away without difficulty. The gallbladder was retracted in a dynamic fashion in order to expose the triangle of Calot. The cystic duct was first identified. Its junction to the infundibulum was fully identified. Endoclips placed proximally distally on the cystic duct, and the cystic duct was divided. This was likewise done to the cystic artery. The gallbladder was freed away from the gallbladder fossa using Bovie electrocautery. The gallbladder was delivered through the epigastric  trocar site using an Endo Catch bag. The gallbladder fossa was inspected and no abnormal bleeding or bile leakage was noted. Surgicel is placed the gallbladder fossa. All fluid and air were then evacuated from the abdominal cavity prior to removal of the trochars.  All wounds were irrigated with normal saline. All wounds were injected with 0.5% Sensorcaine. The supraumbilical fascia was reapproximated using an 0 Vicryl interrupted suture. All skin incisions were closed using staples. Betadine ointment and dry sterile dressings were applied.  All tape and needle counts were correct at the end of the procedure. Patient was extubated in the operating room and transferred to PACU in stable condition.  Complications:  None  EBL:  Minimal  Specimen:  Gallbladder

## 2014-11-06 NOTE — Interval H&P Note (Signed)
History and Physical Interval Note:  11/06/2014 8:17 AM  Susan Wheeler  has presented today for surgery, with the diagnosis of chronic cholecystitis  The various methods of treatment have been discussed with the patient and family. After consideration of risks, benefits and other options for treatment, the patient has consented to  Procedure(s): LAPAROSCOPIC CHOLECYSTECTOMY (N/A) as a surgical intervention .  The patient's history has been reviewed, patient examined, no change in status, stable for surgery.  I have reviewed the patient's chart and labs.  Questions were answered to the patient's satisfaction.     Franky Macho A

## 2014-11-06 NOTE — Discharge Instructions (Signed)

## 2014-11-06 NOTE — Anesthesia Procedure Notes (Signed)
Procedure Name: Intubation Date/Time: 11/06/2014 8:56 AM Performed by: Pernell Dupre, AMY A Pre-anesthesia Checklist: Patient identified, Patient being monitored, Timeout performed, Emergency Drugs available and Suction available Patient Re-evaluated:Patient Re-evaluated prior to inductionOxygen Delivery Method: Circle System Utilized Preoxygenation: Pre-oxygenation with 100% oxygen Intubation Type: IV induction, Rapid sequence and Cricoid Pressure applied Laryngoscope Size: 3 and Miller Grade View: Grade I Tube type: Oral Tube size: 7.0 mm Number of attempts: 1 Airway Equipment and Method: stylet Placement Confirmation: ETT inserted through vocal cords under direct vision,  positive ETCO2 and breath sounds checked- equal and bilateral Secured at: 21 cm Tube secured with: Tape Dental Injury: Teeth and Oropharynx as per pre-operative assessment

## 2014-11-07 ENCOUNTER — Encounter (HOSPITAL_COMMUNITY): Payer: Self-pay | Admitting: General Surgery

## 2015-04-25 ENCOUNTER — Encounter: Payer: Self-pay | Admitting: Adult Health

## 2015-04-25 ENCOUNTER — Ambulatory Visit (INDEPENDENT_AMBULATORY_CARE_PROVIDER_SITE_OTHER): Payer: Medicaid Other | Admitting: Adult Health

## 2015-04-25 VITALS — BP 128/76 | HR 88 | Ht 65.0 in | Wt 185.0 lb

## 2015-04-25 DIAGNOSIS — A499 Bacterial infection, unspecified: Secondary | ICD-10-CM

## 2015-04-25 DIAGNOSIS — N760 Acute vaginitis: Secondary | ICD-10-CM | POA: Diagnosis not present

## 2015-04-25 DIAGNOSIS — B9689 Other specified bacterial agents as the cause of diseases classified elsewhere: Secondary | ICD-10-CM

## 2015-04-25 DIAGNOSIS — IMO0002 Reserved for concepts with insufficient information to code with codable children: Secondary | ICD-10-CM

## 2015-04-25 DIAGNOSIS — N941 Dyspareunia: Secondary | ICD-10-CM | POA: Diagnosis not present

## 2015-04-25 DIAGNOSIS — L298 Other pruritus: Secondary | ICD-10-CM

## 2015-04-25 DIAGNOSIS — N898 Other specified noninflammatory disorders of vagina: Secondary | ICD-10-CM | POA: Insufficient documentation

## 2015-04-25 DIAGNOSIS — Z139 Encounter for screening, unspecified: Secondary | ICD-10-CM

## 2015-04-25 HISTORY — DX: Acute vaginitis: N76.0

## 2015-04-25 HISTORY — DX: Other specified noninflammatory disorders of vagina: N89.8

## 2015-04-25 HISTORY — DX: Other specified bacterial agents as the cause of diseases classified elsewhere: B96.89

## 2015-04-25 HISTORY — DX: Reserved for concepts with insufficient information to code with codable children: IMO0002

## 2015-04-25 LAB — POCT URINALYSIS DIPSTICK
Blood, UA: NEGATIVE
Glucose, UA: NEGATIVE
Leukocytes, UA: NEGATIVE
Nitrite, UA: NEGATIVE

## 2015-04-25 LAB — POCT WET PREP (WET MOUNT): WBC, Wet Prep HPF POC: POSITIVE

## 2015-04-25 MED ORDER — VALACYCLOVIR HCL 1 G PO TABS: 1000.0000 mg | ORAL_TABLET | Freq: Two times a day (BID) | ORAL | Status: DC

## 2015-04-25 MED ORDER — METRONIDAZOLE 500 MG PO TABS: 500.0000 mg | ORAL_TABLET | Freq: Two times a day (BID) | ORAL | Status: DC

## 2015-04-25 MED ORDER — FLUCONAZOLE 150 MG PO TABS: ORAL_TABLET | ORAL | Status: DC

## 2015-04-25 NOTE — Addendum Note (Signed)
Addended by: Colen Darling on: 04/25/2015 10:01 AM   Modules accepted: Orders

## 2015-04-25 NOTE — Patient Instructions (Signed)
Bacterial Vaginosis Bacterial vaginosis is a vaginal infection that occurs when the normal balance of bacteria in the vagina is disrupted. It results from an overgrowth of certain bacteria. This is the most common vaginal infection in women of childbearing age. Treatment is important to prevent complications, especially in pregnant women, as it can cause a premature delivery. CAUSES  Bacterial vaginosis is caused by an increase in harmful bacteria that are normally present in smaller amounts in the vagina. Several different kinds of bacteria can cause bacterial vaginosis. However, the reason that the condition develops is not fully understood. RISK FACTORS Certain activities or behaviors can put you at an increased risk of developing bacterial vaginosis, including:  Having a new sex partner or multiple sex partners.  Douching.  Using an intrauterine device (IUD) for contraception. Women do not get bacterial vaginosis from toilet seats, bedding, swimming pools, or contact with objects around them. SIGNS AND SYMPTOMS  Some women with bacterial vaginosis have no signs or symptoms. Common symptoms include:  Grey vaginal discharge.  A fishlike odor with discharge, especially after sexual intercourse.  Itching or burning of the vagina and vulva.  Burning or pain with urination. DIAGNOSIS  Your health care provider will take a medical history and examine the vagina for signs of bacterial vaginosis. A sample of vaginal fluid may be taken. Your health care provider will look at this sample under a microscope to check for bacteria and abnormal cells. A vaginal pH test may also be done.  TREATMENT  Bacterial vaginosis may be treated with antibiotic medicines. These may be given in the form of a pill or a vaginal cream. A second round of antibiotics may be prescribed if the condition comes back after treatment.  HOME CARE INSTRUCTIONS   Only take over-the-counter or prescription medicines as  directed by your health care provider.  If antibiotic medicine was prescribed, take it as directed. Make sure you finish it even if you start to feel better.  Do not have sex until treatment is completed.  Tell all sexual partners that you have a vaginal infection. They should see their health care provider and be treated if they have problems, such as a mild rash or itching.  Practice safe sex by using condoms and only having one sex partner. SEEK MEDICAL CARE IF:   Your symptoms are not improving after 3 days of treatment.  You have increased discharge or pain.  You have a fever. MAKE SURE YOU:   Understand these instructions.  Will watch your condition.  Will get help right away if you are not doing well or get worse. FOR MORE INFORMATION  Centers for Disease Control and Prevention, Division of STD Prevention: [employer] American Sexual Health Association (ASHA): www.ashastd.org  Document Released: 10/20/2005 Document Revised: 08/10/2013 Document Reviewed: 06/01/2013 [facility] Patient Information 2015 [place], [place]. This information is not intended to replace advice given to you by your health care provider. Make sure you discuss any questions you have with your health care provider. Take flagyl, no alcohol Will talk when culture back

## 2015-04-25 NOTE — Progress Notes (Signed)
Subjective:     Patient ID: Susan Wheeler, female   DOB: [birth year], 39 y.o.   MRN: [id number]  HPI [name] is a 40 year old white female, married, new to this practice, in complaining of pain with sex off and on for 2 months and cramps after, no new partners, also has vaginal itching since taking antibiotics.She also has some urinary frequency at times and SUI.Has itching before periods too,near clitoris.And sometimes sees mucous discharge with blood in it.  Review of Systems Patient denies any headaches, hearing loss, fatigue, blurred vision, shortness of breath, chest pain, abdominal pain, problems with bowel movements.  No joint pain or mood swings. See HPI for positives.  Reviewed past medical,surgical, social and family history. Reviewed medications and allergies.     Objective:   Physical Exam BP 128/76 mmHg  Pulse 88  Ht 5\' 5"  (1.651 m)  Wt 185 lb (83.915 kg)  BMI 30.79 kg/m2  LMP 04/02/2015 urine trace protein, Skin warm and dry.Pelvic: external genitalia is normal in appearance,except for raw, excoriated, linear area left labia,HSV culture obtained, vagina: white discharge with slight odor,urethra has no lesions or masses noted, cervix:smooth and bulbous, uterus: normal size, shape and contour, non tender, no masses felt, adnexa: no masses or tenderness noted. Bladder is non tender and no masses felt. Wet prep: + for clue cells and +WBCs. GC/CHL obtained. Discussed could be herpes or where she scratched, will rx valtrex to [facility] now.Discussed BV and need to try different positions with sex and have him withdraw,because semen can cause some cramping at times.    Assessment:     Dyspareunia Vaginal itch  Vaginal discharge BV      Plan:    HSV culture sent to [facility] herpes Rx flagyl 500 mg 1 bid x 7 days, no alcohol, review handout on BV   Rx valtrex 1000 mg #20 take 1 bid with 1 refill Rx diflucan 150 mg #1 take 1 before menses with 3 refills GC/CHL sent Will talk when labs  back

## 2015-04-27 ENCOUNTER — Emergency Department (HOSPITAL_COMMUNITY)
Admission: EM | Admit: 2015-04-27 | Discharge: 2015-04-27 | Disposition: A | Discharge status: Home / Self Care | Payer: Medicaid Other | Attending: Emergency Medicine | Admitting: Emergency Medicine

## 2015-04-27 ENCOUNTER — Encounter (HOSPITAL_COMMUNITY): Payer: Self-pay | Admitting: Emergency Medicine

## 2015-04-27 DIAGNOSIS — H02843 Edema of right eye, unspecified eyelid: Secondary | ICD-10-CM

## 2015-04-27 DIAGNOSIS — Z8719 Personal history of other diseases of the digestive system: Secondary | ICD-10-CM | POA: Insufficient documentation

## 2015-04-27 DIAGNOSIS — Z3202 Encounter for pregnancy test, result negative: Secondary | ICD-10-CM | POA: Diagnosis not present

## 2015-04-27 DIAGNOSIS — H02841 Edema of right upper eyelid: Secondary | ICD-10-CM | POA: Insufficient documentation

## 2015-04-27 DIAGNOSIS — Z880 Allergy status to penicillin: Secondary | ICD-10-CM | POA: Diagnosis not present

## 2015-04-27 DIAGNOSIS — L03115 Cellulitis of right lower limb: Secondary | ICD-10-CM | POA: Diagnosis not present

## 2015-04-27 DIAGNOSIS — Z8659 Personal history of other mental and behavioral disorders: Secondary | ICD-10-CM | POA: Diagnosis not present

## 2015-04-27 DIAGNOSIS — Z8742 Personal history of other diseases of the female genital tract: Secondary | ICD-10-CM | POA: Insufficient documentation

## 2015-04-27 DIAGNOSIS — M791 Myalgia: Secondary | ICD-10-CM | POA: Diagnosis present

## 2015-04-27 LAB — CBC WITH DIFFERENTIAL/PLATELET
Basophils Absolute: 0 10*3/uL (ref 0.0–0.1)
Basophils Relative: 0 % (ref 0–1)
Eosinophils Absolute: 0.3 10*3/uL (ref 0.0–0.7)
Eosinophils Relative: 4 % (ref 0–5)
HCT: 36.6 % (ref 36.0–46.0)
Hemoglobin: 11.9 g/dL — ABNORMAL LOW (ref 12.0–15.0)
Lymphocytes Relative: 18 % (ref 12–46)
Lymphs Abs: 1.2 10*3/uL (ref 0.7–4.0)
MCH: 25.9 pg — ABNORMAL LOW (ref 26.0–34.0)
MCHC: 32.5 g/dL (ref 30.0–36.0)
MCV: 79.6 fL (ref 78.0–100.0)
Monocytes Absolute: 0.5 10*3/uL (ref 0.1–1.0)
Monocytes Relative: 8 % (ref 3–12)
Neutro Abs: 4.6 10*3/uL (ref 1.7–7.7)
Neutrophils Relative %: 70 % (ref 43–77)
Platelets: 262 10*3/uL (ref 150–400)
RBC: 4.6 MIL/uL (ref 3.87–5.11)
RDW: 15.4 % (ref 11.5–15.5)
WBC: 6.7 10*3/uL (ref 4.0–10.5)

## 2015-04-27 LAB — BASIC METABOLIC PANEL
Anion gap: 9 (ref 5–15)
BUN: 9 mg/dL (ref 6–20)
CO2: 24 mmol/L (ref 22–32)
Calcium: 9 mg/dL (ref 8.9–10.3)
Chloride: 105 mmol/L (ref 101–111)
Creatinine, Ser: 0.85 mg/dL (ref 0.44–1.00)
GFR calc Af Amer: >60 mL/min (ref >60)
GFR calc non Af Amer: >60 mL/min (ref >60)
Glucose, Bld: 87 mg/dL (ref 65–99)
Potassium: 3.9 mmol/L (ref 3.5–5.1)
Sodium: 138 mmol/L (ref 135–145)

## 2015-04-27 LAB — GC/CHLAMYDIA PROBE AMP
Chlamydia trachomatis, NAA: NEGATIVE
Neisseria gonorrhoeae by PCR: NEGATIVE

## 2015-04-27 LAB — POC URINE PREG, ED: Preg Test, Ur: NEGATIVE

## 2015-04-27 MED ORDER — DOXYCYCLINE HYCLATE 100 MG PO TABS
100.0000 mg | ORAL_TABLET | Freq: Once | ORAL | Status: AC
  Administered 2015-04-27: 100 mg via ORAL
  Filled 2015-04-27: qty 1

## 2015-04-27 MED ORDER — DOXYCYCLINE HYCLATE 100 MG PO CAPS: 100.0000 mg | ORAL_CAPSULE | Freq: Two times a day (BID) | ORAL | Status: DC

## 2015-04-27 MED ORDER — IBUPROFEN 600 MG PO TABS: 600.0000 mg | ORAL_TABLET | Freq: Four times a day (QID) | ORAL | Status: DC | PRN

## 2015-04-27 NOTE — Discharge Instructions (Signed)
Cellulitis Cellulitis is an infection of the skin and the tissue beneath it. The infected area is usually red and tender. Cellulitis occurs most often in the arms and lower legs.  CAUSES  Cellulitis is caused by bacteria that enter the skin through cracks or cuts in the skin. The most common types of bacteria that cause cellulitis are staphylococci and streptococci. SIGNS AND SYMPTOMS   Redness and warmth.  Swelling.  Tenderness or pain.  Fever. DIAGNOSIS  Your health care provider can usually determine what is wrong based on a physical exam. Blood tests may also be done. TREATMENT  Treatment usually involves taking an antibiotic medicine. HOME CARE INSTRUCTIONS   Take your antibiotic medicine as directed by your health care provider. Finish the antibiotic even if you start to feel better.  Keep the infected arm or leg elevated to reduce swelling.  Apply a warm cloth to the affected area up to 4 times per day to relieve pain.  Take medicines only as directed by your health care provider.  Keep all follow-up visits as directed by your health care provider. SEEK MEDICAL CARE IF:   You notice red streaks coming from the infected area.  Your red area gets larger or turns dark in color.  Your bone or joint underneath the infected area becomes painful after the skin has healed.  Your infection returns in the same area or another area.  You notice a swollen bump in the infected area.  You develop new symptoms.  You have a fever. SEEK IMMEDIATE MEDICAL CARE IF:   You feel very sleepy.  You develop vomiting or diarrhea.  You have a general ill feeling (malaise) with muscle aches and pains. MAKE SURE YOU:   Understand these instructions.  Will watch your condition.  Will get help right away if you are not doing well or get worse. Document Released: 07/30/2005 Document Revised: 03/06/2014 Document Reviewed: 01/05/2012 [facility] Patient Information 2015 [place], [place].  This information is not intended to replace advice given to you by your health care provider. Make sure you discuss any questions you have with your health care provider.   Take your next dose of the antibiotic tomorrow morning.  Use warm compresses for 15 minutes 3-4 times daily to your leg infection along with elevation as much as possible.  Avoid rubbing your eye.  Stop using the erythromycin ointment.  You may apply a cool compress to your eyelids which can help with swelling and irritation.  Get rechecked here in 2 days if your symptoms are not improving or for any worsened symptoms (spreading redness or worse pain).

## 2015-04-27 NOTE — ED Provider Notes (Signed)
CSN: [id number]     Arrival date & time 04/27/15  1435 History   First MD Initiated Contact with Patient 04/27/15 1530     Chief Complaint  Patient presents with  . Eye Pain  . Generalized Body Aches     (Consider location/radiation/quality/duration/timing/severity/associated sxs/prior Treatment) The history is provided by the patient and a parent.   Susan Wheeler is a 39 y.o. female with no significant past medical history presenting with a 3 week history of redness and tenderness behind her right knee along with right upper eyelid swelling.  She describes cutting herself shaving behind the right knee initially, and within 2 days had mild redness around the site along with right upper eyelid redness, soreness and swelling.  She was seen by an urgent care center and was placed on erythromycin eye ointment and placed on a 10 day course of keflex (diagnosed with blepharitis).  Patient denies having the lower extremity sx evaluated that day.  She completed the keflex approximately 8 days ago, and has developed return of redness and soreness behind the right knee and has now developed soreness again of her right eyelid. She reports aching pain in her right elbow and left shoulder.  She denies injury at these sites.  She has noticed low grade fevers to 99.5 in the evening.  She denies nausea, vomiting, chills, headache and visual changes.  She also denies rash, neck pain or stiffness and denies visual changes.  She is legally blind in her left eye.  She was seen by her gynecologist 2 days ago and placed on flagyl for bacterial vaginosis and empiric acyclovir given abrasions/vs herpes lesion pending cultures.    Past Medical History  Diagnosis Date  . Anxiety   . GERD (gastroesophageal reflux disease)   . Dyspareunia 04/25/2015  . Vaginal itching 04/25/2015  . Vaginal discharge 04/25/2015  . BV (bacterial vaginosis) 04/25/2015   Past Surgical History  Procedure Laterality Date  . Tubal ligation      . Mandible surgery    . Knee arthroscopy Left   . Cholecystectomy N/A 11/06/2014    Procedure: LAPAROSCOPIC CHOLECYSTECTOMY;  Surgeon: Dalia Heading, MD;  Location: [facility];  Service: General;  Laterality: N/A;   Family History  Problem Relation Age of Onset  . Migraines Mother   . Asthma Daughter   . Asthma Son   . Cancer Maternal Grandmother   . COPD Maternal Grandfather   . Asthma Son    History  Substance Use Topics  . Smoking status: Never Smoker   . Smokeless tobacco: Never Used  . Alcohol Use: No   OB History    Gravida Para Term Preterm AB TAB SAB Ectopic Multiple Living   3 3        3      Review of Systems  Constitutional: Positive for fever. Negative for chills.  HENT: Negative for congestion and sore throat.   Eyes: Negative.   Respiratory: Negative for chest tightness and shortness of breath.   Cardiovascular: Negative for chest pain.  Gastrointestinal: Negative for nausea and abdominal pain.  Genitourinary: Negative.   Musculoskeletal: Positive for arthralgias. Negative for joint swelling and neck pain.  Skin: Positive for color change. Negative for rash and wound.  Neurological: Negative for dizziness, weakness, light-headedness, numbness and headaches.  Psychiatric/Behavioral: Negative.       Allergies  Codeine and Penicillins  Home Medications   Prior to Admission medications   Medication Sig Start Date End Date Taking? Authorizing  Provider  erythromycin ophthalmic ointment Place 1 application into the right eye 3 (three) times daily. Filled 04/06/15   Yes Historical Provider, MD  fluconazole (DIFLUCAN) 150 MG tablet Take 1 before period each month as needed Patient taking differently: Take 150 mg by mouth as directed. Take 1 before period each month as needed 04/25/15  Yes Adline Potter, NP  metroNIDAZOLE (FLAGYL) 500 MG tablet Take 1 tablet (500 mg total) by mouth 2 (two) times daily. 04/25/15  Yes Adline Potter, NP  valACYclovir (VALTREX)  1000 MG tablet Take 1 tablet (1,000 mg total) by mouth 2 (two) times daily. 04/25/15  Yes Adline Potter, NP  doxycycline (VIBRAMYCIN) 100 MG capsule Take 1 capsule (100 mg total) by mouth 2 (two) times daily. 04/27/15   Burgess Amor, PA-C  ibuprofen (ADVIL,MOTRIN) 600 MG tablet Take 1 tablet (600 mg total) by mouth every 6 (six) hours as needed. 04/27/15   Burgess Amor, PA-C   BP 128/90 mmHg  Pulse 77  Temp(Src) 98.4 F (36.9 C) (Oral)  Resp 18  Ht  (1.626 m)  Wt 185 lb (83.915 kg)  BMI 31.74 kg/m2  SpO2 100%  LMP 04/02/2015 Physical Exam  Constitutional: She appears well-developed and well-nourished.  HENT:  Head: Normocephalic and atraumatic.  Eyes: EOM are normal. Pupils are equal, round, and reactive to light. Lids are everted and swept, no foreign bodies found. Right conjunctiva is injected.  Right upper eyelid slightly puffiness, mild erythema.  No pain with EOM's.  Sclera clear.    Neck: Normal range of motion.  Cardiovascular: Normal rate, regular rhythm, normal heart sounds and intact distal pulses.   Pulmonary/Chest: Effort normal and breath sounds normal. She has no wheezes.  Abdominal: Soft. Bowel sounds are normal. There is no tenderness.  Musculoskeletal: Normal range of motion.  FROM right elbow, left shoulder.  No erythema or edema in upper extremities.   Neurological: She is alert.  Skin: Skin is warm and dry. There is erythema.     Area of erythema 18 x 24 cm right posterior leg, no red streaking, no significant edema.  Increased warmth. Healing laceration popliteal space.  Psychiatric: She has a normal mood and affect.  Nursing note and vitals reviewed.   ED Course  Procedures (including critical care time) Labs Review Labs Reviewed  CBC WITH DIFFERENTIAL/PLATELET - Abnormal; Notable for the following:    Hemoglobin 11.9 (*)    MCH 25.9 (*)    All other components within normal limits  BASIC METABOLIC PANEL  POC URINE PREG, ED    Imaging Review No  results found.   EKG Interpretation None      MDM   Final diagnoses:  Cellulitis of right lower extremity  Eyelid edema, right    Patients labs and/or radiological studies were reviewed and considered during the medical decision making and disposition process.  Results were also discussed with patient. Pt placed on doxycycline, ibuprofen, advised to dc using erythromycin eye ointment which could be causing eye reaction.  Cool compresses to eye, elevation and heat to leg.  Recheck here in 2 days if not improving or for worsened sx.    Burgess Amor, PA-C 04/27/15 1748  Bethann Berkshire, MD 04/27/15 [phone]

## 2015-04-27 NOTE — ED Notes (Signed)
Patient complaining of right eye pain x 3 weeks. Also complaining of "aching in my joints" x 2 days ago. States she was seen at Urgent Care and given antibiotics but states symptoms have returned. Redness noted to back of right knee.

## 2015-04-28 LAB — HERPES SIMPLEX VIRUS CULTURE

## 2015-04-30 ENCOUNTER — Telehealth: Payer: Self-pay | Admitting: Adult Health

## 2015-04-30 NOTE — Telephone Encounter (Signed)
No longer in service

## 2015-05-17 ENCOUNTER — Telehealth: Payer: Self-pay | Admitting: Adult Health

## 2015-05-17 NOTE — Telephone Encounter (Signed)
Spoke with pt letting her know her GC/CHL and Herpes culture was negative. Pt states she is having another discharge and itching, worse at night. I advised she needs to be seen. Pt voiced understanding. Call transferred to front desk for appt. JSY

## 2015-05-17 NOTE — Telephone Encounter (Signed)
Left message x 1. JSY 

## 2015-05-21 ENCOUNTER — Encounter: Payer: Self-pay | Admitting: Obstetrics & Gynecology

## 2015-05-21 ENCOUNTER — Ambulatory Visit (INDEPENDENT_AMBULATORY_CARE_PROVIDER_SITE_OTHER): Payer: Medicaid Other | Admitting: Obstetrics & Gynecology

## 2015-05-21 VITALS — BP 120/70 | HR 76 | Wt 184.0 lb

## 2015-05-21 DIAGNOSIS — A499 Bacterial infection, unspecified: Secondary | ICD-10-CM | POA: Diagnosis not present

## 2015-05-21 DIAGNOSIS — B9689 Other specified bacterial agents as the cause of diseases classified elsewhere: Secondary | ICD-10-CM

## 2015-05-21 DIAGNOSIS — N760 Acute vaginitis: Secondary | ICD-10-CM | POA: Diagnosis not present

## 2015-05-21 MED ORDER — METRONIDAZOLE 500 MG PO TABS: 500.0000 mg | ORAL_TABLET | Freq: Two times a day (BID) | ORAL | Status: DC

## 2015-05-21 NOTE — Progress Notes (Signed)
Patient ID: Susan Wheeler, female   DOB: 07/23/1976, 39 y.o.   MRN: [id number] Chief Complaint  Patient presents with  . gyn visit    vaginal itchingx several week, has odor.   Blood pressure 120/70, pulse 76, weight 184 lb (83.462 kg), last menstrual period 05/07/2015.   Has off on problems with malodorous vaginal discharge and associated itching Has associated with menses and also after intercourse  Wet Prep:   A sample of vaginal discharge was obtained from the posterior fornix using a cotton swab. 2 drops of saline were placed on a slide and the cotton swab was immersed in the saline. Microscopic evaluation was performed and results were as follows:  Negative  for yeast  Positive for clue cells , consistent with Bacterial vaginosis Negative for trichomonas  Normal WBC population   Whiff test: Positive  BV, recurrent  Metronidazole x 7 days, then femdophilus to keep vaginal bacterial microenvironment stable

## 2015-06-14 ENCOUNTER — Ambulatory Visit: Payer: Medicaid Other | Admitting: Obstetrics & Gynecology

## 2015-06-26 ENCOUNTER — Ambulatory Visit (INDEPENDENT_AMBULATORY_CARE_PROVIDER_SITE_OTHER): Payer: Medicaid Other | Admitting: Obstetrics & Gynecology

## 2015-06-26 ENCOUNTER — Encounter: Payer: Self-pay | Admitting: Obstetrics & Gynecology

## 2015-06-26 ENCOUNTER — Other Ambulatory Visit (HOSPITAL_COMMUNITY)
Admission: RE | Admit: 2015-06-26 | Discharge: 2015-06-26 | Disposition: A | Discharge status: Home / Self Care | Payer: Medicaid Other | Source: Ambulatory Visit | Attending: Obstetrics & Gynecology | Admitting: Obstetrics & Gynecology

## 2015-06-26 VITALS — BP 120/80 | HR 76 | Ht 64.0 in | Wt 180.0 lb

## 2015-06-26 DIAGNOSIS — Z1151 Encounter for screening for human papillomavirus (HPV): Secondary | ICD-10-CM | POA: Diagnosis present

## 2015-06-26 DIAGNOSIS — IMO0002 Reserved for concepts with insufficient information to code with codable children: Secondary | ICD-10-CM

## 2015-06-26 DIAGNOSIS — N946 Dysmenorrhea, unspecified: Secondary | ICD-10-CM

## 2015-06-26 DIAGNOSIS — Z01419 Encounter for gynecological examination (general) (routine) without abnormal findings: Secondary | ICD-10-CM

## 2015-06-26 DIAGNOSIS — Z0000 Encounter for general adult medical examination without abnormal findings: Secondary | ICD-10-CM | POA: Diagnosis not present

## 2015-06-26 DIAGNOSIS — N920 Excessive and frequent menstruation with regular cycle: Secondary | ICD-10-CM

## 2015-06-26 NOTE — Progress Notes (Signed)
Patient ID: Susan Wheeler, female   DOB: 1975-11-30, 39 y.o.   MRN: [id number] Subjective:     Susan Wheeler is a 39 y.o. female here for a routine exam.  Patient's last menstrual period was 06/07/2015. G3P3 Birth Control Method:  BTL Menstrual Calendar(currently): regular  Current complaints: menorrhagia dysmenorrhea dyspareunia.   Current acute medical issues:  none   Recent Gynecologic History Patient's last menstrual period was 06/07/2015. Last Pap: ?years,  normal Last mammogram: ,    Past Medical History  Diagnosis Date  . Anxiety   . GERD (gastroesophageal reflux disease)   . Dyspareunia 04/25/2015  . Vaginal itching 04/25/2015  . Vaginal discharge 04/25/2015  . BV (bacterial vaginosis) 04/25/2015    Past Surgical History  Procedure Laterality Date  . Tubal ligation    . Mandible surgery    . Knee arthroscopy Left   . Cholecystectomy N/A 11/06/2014    Procedure: LAPAROSCOPIC CHOLECYSTECTOMY;  Surgeon: Dalia Heading, MD;  Location: [facility];  Service: General;  Laterality: N/A;    OB History    Gravida Para Term Preterm AB TAB SAB Ectopic Multiple Living   Social History   Social History  . Marital Status: Married    Spouse Name: N/A  . Number of Children: N/A  . Years of Education: N/A   Social History Main Topics  . Smoking status: Never Smoker   . Smokeless tobacco: Never Used  . Alcohol Use: No  . Drug Use: No  . Sexual Activity: Yes    Birth Control/ Protection: Surgical     Comment: tubal   Other Topics Concern  . None   Social History Narrative    Family History  Problem Relation Age of Onset  . Migraines Mother   . Asthma Daughter   . Asthma Son   . Cancer Maternal Grandmother   . COPD Maternal Grandfather   . Asthma Son      Current outpatient prescriptions:  .  ibuprofen (ADVIL,MOTRIN) 600 MG tablet, Take 1 tablet (600 mg total) by mouth every 6 (six) hours as needed., Disp: 30 tablet, Rfl: 0 .  doxycycline  (VIBRAMYCIN) 100 MG capsule, Take 1 capsule (100 mg total) by mouth 2 (two) times daily. (Patient not taking: Reported on 05/21/2015), Disp: 20 capsule, Rfl: 0 .  erythromycin ophthalmic ointment, Place 1 application into the right eye 3 (three) times daily. Filled 04/06/15, Disp: , Rfl:  .  fluconazole (DIFLUCAN) 150 MG tablet, Take 1 before period each month as needed (Patient not taking: Reported on 05/21/2015), Disp: 1 tablet, Rfl: 3 .  metroNIDAZOLE (FLAGYL) 500 MG tablet, Take 1 tablet (500 mg total) by mouth 2 (two) times daily. (Patient not taking: Reported on 06/26/2015), Disp: 14 tablet, Rfl: 0 .  valACYclovir (VALTREX) 1000 MG tablet, Take 1 tablet (1,000 mg total) by mouth 2 (two) times daily. (Patient not taking: Reported on 05/21/2015), Disp: 20 tablet, Rfl: 1  Review of Systems  Review of Systems  Constitutional: Negative for fever, chills, weight loss, malaise/fatigue and diaphoresis.  HENT: Negative for hearing loss, ear pain, nosebleeds, congestion, sore throat, neck pain, tinnitus and ear discharge.   Eyes: Negative for blurred vision, double vision, photophobia, pain, discharge and redness.  Respiratory: Negative for cough, hemoptysis, sputum production, shortness of breath, wheezing and stridor.   Cardiovascular: Negative for chest pain, palpitations, orthopnea, claudication, leg swelling and PND.  Gastrointestinal: negative  for abdominal pain. Negative for heartburn, nausea, vomiting, diarrhea, constipation, blood in stool and melena.  Genitourinary: Negative for dysuria, urgency, frequency, hematuria and flank pain.  Musculoskeletal: Negative for myalgias, back pain, joint pain and falls.  Skin: Negative for itching and rash.  Neurological: Negative for dizziness, tingling, tremors, sensory change, speech change, focal weakness, seizures, loss of consciousness, weakness and headaches.  Endo/Heme/Allergies: Negative for environmental allergies and polydipsia. Does not bruise/bleed  easily.  Psychiatric/Behavioral: Negative for depression, suicidal ideas, hallucinations, memory loss and substance abuse. The patient is not nervous/anxious and does not have insomnia.        Objective:  Blood pressure 120/80, pulse 76, height 5\' 4"  (1.626 m), weight 180 lb (81.647 kg), last menstrual period 06/07/2015.   Physical Exam  Vitals reviewed. Constitutional: She is oriented to person, place, and time. She appears well-developed and well-nourished.  HENT:  Head: Normocephalic and atraumatic.        Right Ear: External ear normal.  Left Ear: External ear normal.  Nose: Nose normal.  Mouth/Throat: Oropharynx is clear and moist.  Eyes: Conjunctivae and EOM are normal. Pupils are equal, round, and reactive to light. Right eye exhibits no discharge. Left eye exhibits no discharge. No scleral icterus.  Neck: Normal range of motion. Neck supple. No tracheal deviation present. No thyromegaly present.  Cardiovascular: Normal rate, regular rhythm, normal heart sounds and intact distal pulses.  Exam reveals no gallop and no friction rub.   No murmur heard. Respiratory: Effort normal and breath sounds normal. No respiratory distress. She has no wheezes. She has no rales. She exhibits no tenderness.  GI: Soft. Bowel sounds are normal. She exhibits no distension and no mass. There is no tenderness. There is no rebound and no guarding.  Genitourinary:  Breasts no masses skin changes or nipple changes bilaterally      Vulva is normal without lesions Vagina is pink moist without discharge Cervix normal in appearance and pap is done Uterus is 8-10 size shape and contour, ?fibroids Adnexa is negative with normal sized ovaries   Musculoskeletal: Normal range of motion. She exhibits no edema and no tenderness.  Neurological: She is alert and oriented to person, place, and time. She has normal reflexes. She displays normal reflexes. No cranial nerve deficit. She exhibits normal muscle tone.  Coordination normal.  Skin: Skin is warm and dry. No rash noted. No erythema. No pallor.  Psychiatric: She has a normal mood and affect. Her behavior is normal. Judgment and thought content normal.       Assessment:    Healthy female exam.    Plan:    Contraception: tubal ligation. Follow up in: 2 weeks. sonogram to evaluate uterine sixe

## 2015-06-27 LAB — CYTOLOGY - PAP

## 2015-07-10 ENCOUNTER — Encounter: Payer: Self-pay | Admitting: Obstetrics & Gynecology

## 2015-07-10 ENCOUNTER — Ambulatory Visit (INDEPENDENT_AMBULATORY_CARE_PROVIDER_SITE_OTHER): Payer: Medicaid Other

## 2015-07-10 ENCOUNTER — Ambulatory Visit (INDEPENDENT_AMBULATORY_CARE_PROVIDER_SITE_OTHER): Payer: Medicaid Other | Admitting: Obstetrics & Gynecology

## 2015-07-10 VITALS — BP 110/80 | HR 72 | Wt 177.0 lb

## 2015-07-10 DIAGNOSIS — N946 Dysmenorrhea, unspecified: Secondary | ICD-10-CM | POA: Diagnosis not present

## 2015-07-10 DIAGNOSIS — N841 Polyp of cervix uteri: Secondary | ICD-10-CM | POA: Diagnosis not present

## 2015-07-10 DIAGNOSIS — N920 Excessive and frequent menstruation with regular cycle: Secondary | ICD-10-CM | POA: Diagnosis not present

## 2015-07-10 DIAGNOSIS — N941 Dyspareunia: Secondary | ICD-10-CM

## 2015-07-10 DIAGNOSIS — IMO0002 Reserved for concepts with insufficient information to code with codable children: Secondary | ICD-10-CM

## 2015-07-10 NOTE — Progress Notes (Signed)
Patient ID: Susan Wheeler, female   DOB: [birth year], 39 y.o.   MRN: [id number] Preoperative History and Physical  Susan Wheeler is a 39 y.o. G3P3 with Patient's last menstrual period was 06/07/2015. admitted for a vaginal hysterectomy.  She has been having increasing difficulty over the past several years with heavy painful periods and sonogram reveals an endometrial polyp.  However she has also been experiencing increased issues with bump dyspareunia confirmed at the time of her previous exam.  To take care of all of these problems she has opted for [facility]  PMH:    Past Medical History  Diagnosis Date  . Anxiety   . GERD (gastroesophageal reflux disease)   . Dyspareunia 04/25/2015  . Vaginal itching 04/25/2015  . Vaginal discharge 04/25/2015  . BV (bacterial vaginosis) 04/25/2015    PSH:     Past Surgical History  Procedure Laterality Date  . Tubal ligation    . Mandible surgery    . Knee arthroscopy Left   . Cholecystectomy N/A 11/06/2014    Procedure: LAPAROSCOPIC CHOLECYSTECTOMY;  Surgeon: Dalia Heading, MD;  Location: [facility];  Service: General;  Laterality: N/A;    POb/GynH:      OB History    Gravida Para Term Preterm AB TAB SAB Ectopic Multiple Living   3 3        3       SH:   Social History  Substance Use Topics  . Smoking status: Never Smoker   . Smokeless tobacco: Never Used  . Alcohol Use: No    FH:    Family History  Problem Relation Age of Onset  . Migraines Mother   . Asthma Daughter   . Asthma Son   . Cancer Maternal Grandmother   . COPD Maternal Grandfather   . Asthma Son      Allergies:  Allergies  Allergen Reactions  . Codeine Hives  . Penicillins Hives and Nausea And Vomiting    Medications:       Current outpatient prescriptions:  .  doxycycline (VIBRAMYCIN) 100 MG capsule, Take 1 capsule (100 mg total) by mouth 2 (two) times daily. (Patient not taking: Reported on 05/21/2015), Disp: 20 capsule, Rfl: 0 .  erythromycin ophthalmic ointment,  Place 1 application into the right eye 3 (three) times daily. Filled 04/06/15, Disp: , Rfl:  .  fluconazole (DIFLUCAN) 150 MG tablet, Take 1 before period each month as needed (Patient not taking: Reported on 05/21/2015), Disp: 1 tablet, Rfl: 3 .  ibuprofen (ADVIL,MOTRIN) 600 MG tablet, Take 1 tablet (600 mg total) by mouth every 6 (six) hours as needed. (Patient not taking: Reported on 07/10/2015), Disp: 30 tablet, Rfl: 0 .  metroNIDAZOLE (FLAGYL) 500 MG tablet, Take 1 tablet (500 mg total) by mouth 2 (two) times daily. (Patient not taking: Reported on 06/26/2015), Disp: 14 tablet, Rfl: 0 .  valACYclovir (VALTREX) 1000 MG tablet, Take 1 tablet (1,000 mg total) by mouth 2 (two) times daily. (Patient not taking: Reported on 05/21/2015), Disp: 20 tablet, Rfl: 1  Review of Systems:   Review of Systems  Constitutional: Negative for fever, chills, weight loss, malaise/fatigue and diaphoresis.  HENT: Negative for hearing loss, ear pain, nosebleeds, congestion, sore throat, neck pain, tinnitus and ear discharge.   Eyes: Negative for blurred vision, double vision, photophobia, pain, discharge and redness.  Respiratory: Negative for cough, hemoptysis, sputum production, shortness of breath, wheezing and stridor.   Cardiovascular: Negative for chest pain, palpitations, orthopnea, claudication, leg swelling and  PND.  Gastrointestinal: Positive for abdominal pain. Negative for heartburn, nausea, vomiting, diarrhea, constipation, blood in stool and melena.  Genitourinary: Negative for dysuria, urgency, frequency, hematuria and flank pain.  Musculoskeletal: Negative for myalgias, back pain, joint pain and falls.  Skin: Negative for itching and rash.  Neurological: Negative for dizziness, tingling, tremors, sensory change, speech change, focal weakness, seizures, loss of consciousness, weakness and headaches.  Endo/Heme/Allergies: Negative for environmental allergies and polydipsia. Does not bruise/bleed easily.   Psychiatric/Behavioral: Negative for depression, suicidal ideas, hallucinations, memory loss and substance abuse. The patient is not nervous/anxious and does not have insomnia.      PHYSICAL EXAM:  Blood pressure 110/80, pulse 72, weight 177 lb (80.287 kg), last menstrual period 06/07/2015.    Vitals reviewed. Constitutional: She is oriented to person, place, and time. She appears well-developed and well-nourished.  HENT:  Head: Normocephalic and atraumatic.  Right Ear: External ear normal.  Left Ear: External ear normal.  Nose: Nose normal.  Mouth/Throat: Oropharynx is clear and moist.  Eyes: Conjunctivae and EOM are normal. Pupils are equal, round, and reactive to light. Right eye exhibits no discharge. Left eye exhibits no discharge. No scleral icterus.  Neck: Normal range of motion. Neck supple. No tracheal deviation present. No thyromegaly present.  Cardiovascular: Normal rate, regular rhythm, normal heart sounds and intact distal pulses.  Exam reveals no gallop and no friction rub.   No murmur heard. Respiratory: Effort normal and breath sounds normal. No respiratory distress. She has no wheezes. She has no rales. She exhibits no tenderness.  GI: Soft. Bowel sounds are normal. She exhibits no distension and no mass. There is tenderness. There is no rebound and no guarding.  Genitourinary:       Vulva is normal without lesions Vagina is pink moist without discharge Cervix normal in appearance and pap is normal, + pain with cervical manipulation similar to intercourse manipulation Uterus is normal size, contour, position, consistency, mobility, non-tender Adnexa is negative with normal sized ovaries by sonogram  Musculoskeletal: Normal range of motion. She exhibits no edema and no tenderness.  Neurological: She is alert and oriented to person, place, and time. She has normal reflexes. She displays normal reflexes. No cranial nerve deficit. She exhibits normal muscle tone.  Coordination normal.  Skin: Skin is warm and dry. No rash noted. No erythema. No pallor.  Psychiatric: She has a normal mood and affect. Her behavior is normal. Judgment and thought content normal.    Labs: No results found for this or any previous visit (from the past 336 hour(s)).  EKG: No orders found for this or any previous visit.  Imaging Studies: US Transvaginal Non-ob  2015/07/20   GYNECOLOGIC SONOGRAM   SHANORA CHRISTENSEN is a 39 y.o. G3P3 LMP 06/07/2015 for a pelvic sonogram for  menorrhagia,dysmenorrhea, and enlarged uterus.  Uterus                      10.29 x 7.7 x 5.47 cm, normal anteverted  uterus   Endometrium          15 mm, symmetrical,  EEC 15mm w/a 1.7 x .7 x 1.2cm  echogenic endometrial mass w/color flow ? polyp   Right ovary             2.03 x 1.8 x 2.2 cm, wnl  Left ovary                1.8 x 1.7 x 2.1 cm, wnl  Technician Comments: US PELVIS TA/TV: normal anteverted uterus,normal ov 's bilat (mobile),no  free fluid seen, EEC 15mm w/a 1.7 x .7 x 1.2cm echogenic endometrial mass  w/color flow ? polyp     Karie Chimera 07/10/2015 12:22 PM  Clinical Impression and recommendations:  I have reviewed the sonogram results above, combined with the patient's  current clinical course, below are my impressions and any appropriate  recommendations for management based on the sonographic findings.  Endometrium with 1.7 cm endometrial polyp Uterus is normal Both ovaries are normal   EURE,LUTHER H 07/10/2015 2:27 PM    US Pelvis Complete  07/10/2015   GYNECOLOGIC SONOGRAM   DEBORA STOCKDALE is a 39 y.o. G3P3 LMP 06/07/2015 for a pelvic sonogram for  menorrhagia,dysmenorrhea, and enlarged uterus.  Uterus                      10.29 x 7.7 x 5.47 cm, normal anteverted  uterus   Endometrium          15 mm, symmetrical,  EEC 15mm w/a 1.7 x .7 x 1.2cm  echogenic endometrial mass w/color flow ? polyp   Right ovary             2.03 x 1.8 x 2.2 cm, wnl  Left ovary                1.8 x 1.7 x 2.1 cm, wnl    Technician  Comments: US PELVIS TA/TV: normal anteverted uterus,normal ov 's bilat (mobile),no  free fluid seen, EEC 15mm w/a 1.7 x .7 x 1.2cm echogenic endometrial mass  w/color flow ? polyp     Karie Chimera 07/10/2015 12:22 PM  Clinical Impression and recommendations:  I have reviewed the sonogram results above, combined with the patient's  current clinical course, below are my impressions and any appropriate  recommendations for management based on the sonographic findings.  Endometrium with 1.7 cm endometrial polyp Uterus is normal Both ovaries are normal   EURE,LUTHER H 07/10/2015 2:27 PM       Assessment: Dysmenorrhea Dyspareunia menorrhagia   Patient Active Problem List   Diagnosis Date Noted  . Dyspareunia 04/25/2015  . Vaginal itching 04/25/2015  . Vaginal discharge 04/25/2015  . BV (bacterial vaginosis) 04/25/2015  . NAUSEA 01/22/2009  . ABDOMINAL PAIN 01/22/2009  . RUQ PAIN 01/22/2009    Plan: Pt has on going pain with intercourse with interferes with her sexual frequency and experience  Given the choic between a hysteroscopy uterine curettage with poly p removal and endometrial ablation vs a vaginal hysterectomy to address the dyspareunia as well, despite the differences in the perioperative and intra op risk, she decides on the vaginal hysterectomy  It is scheduled for 08/15/2015  Pt understands the risks of surgery including but not limited t  excessive bleeding requiring transfusion or reoperation, post-operative infection requiring prolonged hospitalization or re-hospitalization and antibiotic therapy, and damage to other organs including bladder, bowel, ureters and major vessels.  The patient also understands the alternative treatment options which were discussed in full.  All questions were answered.  EURE,LUTHER H 07/10/2015 2:32 PM   EURE,LUTHER H 07/10/2015 2:29 PM

## 2015-07-10 NOTE — Progress Notes (Signed)
US PELVIS TA/TV: normal anteverted uterus,normal ov 's bilat (mobile),no free fluid seen, EEC 15mm w/a 1.7 x .7 x 1.2cm echogenic endometrial mass w/color flow ? polyp

## 2015-08-08 NOTE — Patient Instructions (Signed)
Susan Wheeler  08/08/2015     @   Your procedure is scheduled on 08/15/2015.  Report to [facility] at 7:00 A.M.  Call this number if you have problems the morning of surgery:  [phone]   Remember:  Do not eat food or drink liquids after midnight.  Take these medicines the morning of surgery with A SIP OF WATER : NONE    Do not wear jewelry, make-up or nail polish.  Do not wear lotions, powders, or perfumes.  You may wear deodorant.  Do not shave 48 hours prior to surgery.  Men may shave face and neck.  Do not bring valuables to the hospital.  [facility] is not responsible for any belongings or valuables.  Contacts, dentures or bridgework may not be worn into surgery.  Leave your suitcase in the car.  After surgery it may be brought to your room.  For patients admitted to the hospital, discharge time will be determined by your treatment team.  Patients discharged the day of surgery will not be allowed to drive home.   Name and phone number of your driver:   FAMILY Special instructions: Please read over the following fact sheets that you were given. Care and Recovery After Surgery   Laparoscopically Assisted Vaginal Hysterectomy A laparoscopically assisted vaginal hysterectomy (LAVH) is a surgical procedure to remove the uterus and cervix, and sometimes the ovaries and fallopian tubes. During an LAVH, some of the surgical removal is done through the vagina, and the rest is done through a few small surgical cuts (incisions) in the abdomen.  This procedure is usually considered in women when a vaginal hysterectomy is not an option. Your health care provider will discuss the risks and benefits of the different surgical techniques at your appointment. Generally, recovery time is faster and there are fewer complications after laparoscopic procedures than after open incisional procedures. LET [facility] CARE PROVIDER KNOW ABOUT:   Any allergies you have.  All  medicines you are taking, including vitamins, herbs, eye drops, creams, and over-the-counter medicines.  Previous problems you or members of your family have had with the use of anesthetics.  Any blood disorders you have.  Previous surgeries you have had.  Medical conditions you have. RISKS AND COMPLICATIONS Generally, this is a safe procedure. However, as with any procedure, complications can occur. Possible complications include:  Allergies to medicines.  Difficulty breathing.  Bleeding.  Infection.  Damage to other structures near your uterus and cervix. BEFORE THE PROCEDURE  Ask your health care provider about changing or stopping your regular medicines.  Take certain medicines, such as a colon-emptying preparation, as directed.  Do not eat or drink anything for at least 8 hours before your surgery.  Stop smoking if you smoke. Stopping will improve your health after surgery.  Arrange for a ride home after surgery and for help at home during recovery. PROCEDURE   An IV tube will be put into one of your veins in order to give you fluids and medicines.  You will receive medicines to relax you and medicines that make you sleep (general anesthetic).  You may have a flexible tube (catheter) put into your bladder to drain urine.  You may have a tube put through your nose or mouth that goes into your stomach (nasogastric tube). The nasogastric tube removes digestive fluids and prevents you from feeling nauseated and from vomiting.  Tight-fitting (compression) stockings will be placed on your legs to promote circulation.  Three to four small incisions will be made in your abdomen. An incision also will be made in your vagina. Probes and tools will be inserted into the small incisions. The uterus and cervix are removed (and possibly your ovaries and fallopian tubes) through your vagina as well as through the small incisions that were made in the abdomen.  Your vagina is then  sewn back to normal. AFTER THE PROCEDURE  You may have a liquid diet temporarily. You will most likely return to, and tolerate, your usual diet the day after surgery.  You will be passing urine through a catheter. It will be removed the day after surgery.  Your temperature, breathing rate, heart rate, blood pressure, and oxygen level will be monitored regularly.  You will still wear compression stockings on your legs until you are able to move around.  You will use a special device or do breathing exercises to keep your lungs clear.  You will be encouraged to walk as soon as possible.   This information is not intended to replace advice given to you by your health care provider. Make sure you discuss any questions you have with your health care provider.   Document Released: 10/09/2011 Document Revised: 11/10/2014 Document Reviewed: 05/05/2013 Elsevier Interactive Patient Education [employer]

## 2015-08-09 ENCOUNTER — Other Ambulatory Visit: Payer: Self-pay | Admitting: Obstetrics & Gynecology

## 2015-08-10 ENCOUNTER — Telehealth: Payer: Self-pay | Admitting: *Deleted

## 2015-08-10 ENCOUNTER — Encounter (HOSPITAL_COMMUNITY): Payer: Self-pay

## 2015-08-10 ENCOUNTER — Encounter (HOSPITAL_COMMUNITY)
Admission: RE | Admit: 2015-08-10 | Discharge: 2015-08-10 | Disposition: A | Discharge status: Home / Self Care | Payer: Medicaid Other | Source: Ambulatory Visit | Attending: Obstetrics & Gynecology | Admitting: Obstetrics & Gynecology

## 2015-08-10 DIAGNOSIS — Z01818 Encounter for other preprocedural examination: Secondary | ICD-10-CM | POA: Insufficient documentation

## 2015-08-10 DIAGNOSIS — N920 Excessive and frequent menstruation with regular cycle: Secondary | ICD-10-CM | POA: Insufficient documentation

## 2015-08-10 DIAGNOSIS — N946 Dysmenorrhea, unspecified: Secondary | ICD-10-CM | POA: Diagnosis not present

## 2015-08-10 DIAGNOSIS — N941 Unspecified dyspareunia: Secondary | ICD-10-CM | POA: Diagnosis not present

## 2015-08-10 LAB — URINE MICROSCOPIC-ADD ON

## 2015-08-10 LAB — CBC
HCT: 34.4 % — ABNORMAL LOW (ref 36.0–46.0)
Hemoglobin: 11.3 g/dL — ABNORMAL LOW (ref 12.0–15.0)
MCH: 26.3 pg (ref 26.0–34.0)
MCHC: 32.8 g/dL (ref 30.0–36.0)
MCV: 80 fL (ref 78.0–100.0)
Platelets: 225 10*3/uL (ref 150–400)
RBC: 4.3 MIL/uL (ref 3.87–5.11)
RDW: 13.9 % (ref 11.5–15.5)
WBC: 5.7 10*3/uL (ref 4.0–10.5)

## 2015-08-10 LAB — URINALYSIS, ROUTINE W REFLEX MICROSCOPIC
Bilirubin Urine: NEGATIVE
Glucose, UA: NEGATIVE mg/dL
Ketones, ur: NEGATIVE mg/dL
Nitrite: NEGATIVE
Protein, ur: NEGATIVE mg/dL
Specific Gravity, Urine: 1.025 (ref 1.005–1.030)
Urobilinogen, UA: 0.2 mg/dL (ref 0.0–1.0)
pH: 5.5 (ref 5.0–8.0)

## 2015-08-10 LAB — COMPREHENSIVE METABOLIC PANEL
ALT: 45 U/L (ref 14–54)
AST: 33 U/L (ref 15–41)
Albumin: 3.9 g/dL (ref 3.5–5.0)
Alkaline Phosphatase: 47 U/L (ref 38–126)
Anion gap: 4 — ABNORMAL LOW (ref 5–15)
BUN: 12 mg/dL (ref 6–20)
CO2: 25 mmol/L (ref 22–32)
Calcium: 8.7 mg/dL — ABNORMAL LOW (ref 8.9–10.3)
Chloride: 109 mmol/L (ref 101–111)
Creatinine, Ser: 0.84 mg/dL (ref 0.44–1.00)
GFR calc Af Amer: >60 mL/min (ref >60)
GFR calc non Af Amer: >60 mL/min (ref >60)
Glucose, Bld: 99 mg/dL (ref 65–99)
Potassium: 4.4 mmol/L (ref 3.5–5.1)
Sodium: 138 mmol/L (ref 135–145)
Total Bilirubin: 0.5 mg/dL (ref 0.3–1.2)
Total Protein: 7 g/dL (ref 6.5–8.1)

## 2015-08-10 LAB — TYPE AND SCREEN
ABO/RH(D): A NEG
Antibody Screen: NEGATIVE

## 2015-08-10 LAB — HCG, QUANTITATIVE, PREGNANCY: hCG, Beta Chain, Quant, S: <1 m[IU]/mL (ref <5)

## 2015-08-10 NOTE — Telephone Encounter (Signed)
Pt states she is having a vaginal hysterectomy and had pre op labs done at [facility] was given pre op instruction stating she should take a med for bowel prep. Informed pt it was my understanding that she would need to go to where she would need to f/u with the hospital for pre op instructions. Pt verbalized understanding.

## 2015-08-15 ENCOUNTER — Encounter (HOSPITAL_COMMUNITY)
Admission: RE | Disposition: A | Discharge status: Home / Self Care | Payer: Self-pay | Source: Ambulatory Visit | Attending: Obstetrics & Gynecology

## 2015-08-15 ENCOUNTER — Ambulatory Visit (HOSPITAL_COMMUNITY): Payer: Medicaid Other | Admitting: Anesthesiology

## 2015-08-15 ENCOUNTER — Encounter (HOSPITAL_COMMUNITY): Payer: Self-pay | Admitting: *Deleted

## 2015-08-15 ENCOUNTER — Observation Stay (HOSPITAL_COMMUNITY)
Admission: RE | Admit: 2015-08-15 | Discharge: 2015-08-16 | Disposition: A | Discharge status: Home / Self Care | Payer: Medicaid Other | Source: Ambulatory Visit | Attending: Obstetrics & Gynecology | Admitting: Obstetrics & Gynecology

## 2015-08-15 DIAGNOSIS — N840 Polyp of corpus uteri: Secondary | ICD-10-CM | POA: Insufficient documentation

## 2015-08-15 DIAGNOSIS — Z880 Allergy status to penicillin: Secondary | ICD-10-CM | POA: Diagnosis not present

## 2015-08-15 DIAGNOSIS — Z885 Allergy status to narcotic agent status: Secondary | ICD-10-CM | POA: Diagnosis not present

## 2015-08-15 DIAGNOSIS — N921 Excessive and frequent menstruation with irregular cycle: Secondary | ICD-10-CM | POA: Diagnosis not present

## 2015-08-15 DIAGNOSIS — N941 Unspecified dyspareunia: Secondary | ICD-10-CM | POA: Insufficient documentation

## 2015-08-15 DIAGNOSIS — N920 Excessive and frequent menstruation with regular cycle: Secondary | ICD-10-CM | POA: Diagnosis not present

## 2015-08-15 DIAGNOSIS — N803 Endometriosis of pelvic peritoneum: Secondary | ICD-10-CM | POA: Insufficient documentation

## 2015-08-15 DIAGNOSIS — F419 Anxiety disorder, unspecified: Secondary | ICD-10-CM | POA: Insufficient documentation

## 2015-08-15 DIAGNOSIS — Z90710 Acquired absence of both cervix and uterus: Secondary | ICD-10-CM | POA: Diagnosis present

## 2015-08-15 DIAGNOSIS — K219 Gastro-esophageal reflux disease without esophagitis: Secondary | ICD-10-CM | POA: Diagnosis not present

## 2015-08-15 DIAGNOSIS — N946 Dysmenorrhea, unspecified: Secondary | ICD-10-CM | POA: Insufficient documentation

## 2015-08-15 HISTORY — PX: VAGINAL HYSTERECTOMY: SHX2639

## 2015-08-15 SURGERY — HYSTERECTOMY, VAGINAL
Anesthesia: General

## 2015-08-15 MED ORDER — ALUM & MAG HYDROXIDE-SIMETH 200-200-20 MG/5ML PO SUSP: 30.0000 mL | ORAL | Status: DC | PRN

## 2015-08-15 MED ORDER — FENTANYL CITRATE (PF) 100 MCG/2ML IJ SOLN
INTRAMUSCULAR | Status: AC
  Filled 2015-08-15: qty 2

## 2015-08-15 MED ORDER — ONDANSETRON HCL 4 MG/2ML IJ SOLN
4.0000 mg | Freq: Once | INTRAMUSCULAR | Status: AC
  Administered 2015-08-15: 4 mg via INTRAVENOUS

## 2015-08-15 MED ORDER — BUPIVACAINE-EPINEPHRINE (PF) 0.5% -1:200000 IJ SOLN
INTRAMUSCULAR | Status: AC
  Filled 2015-08-15: qty 30

## 2015-08-15 MED ORDER — KETOROLAC TROMETHAMINE 30 MG/ML IJ SOLN
30.0000 mg | Freq: Once | INTRAMUSCULAR | Status: AC
  Administered 2015-08-15: 30 mg via INTRAVENOUS
  Filled 2015-08-15: qty 1

## 2015-08-15 MED ORDER — FENTANYL CITRATE (PF) 100 MCG/2ML IJ SOLN
INTRAMUSCULAR | Status: AC
  Filled 2015-08-15: qty 4

## 2015-08-15 MED ORDER — SUCCINYLCHOLINE CHLORIDE 20 MG/ML IJ SOLN
INTRAMUSCULAR | Status: AC
  Filled 2015-08-15: qty 2

## 2015-08-15 MED ORDER — STERILE WATER FOR IRRIGATION IR SOLN
Status: DC | PRN
  Administered 2015-08-15: 1000 mL

## 2015-08-15 MED ORDER — LIDOCAINE HCL (PF) 1 % IJ SOLN
INTRAMUSCULAR | Status: AC
  Filled 2015-08-15: qty 25

## 2015-08-15 MED ORDER — DEXAMETHASONE SODIUM PHOSPHATE 4 MG/ML IJ SOLN
4.0000 mg | Freq: Once | INTRAMUSCULAR | Status: AC
  Administered 2015-08-15: 4 mg via INTRAVENOUS

## 2015-08-15 MED ORDER — ONDANSETRON HCL 4 MG/2ML IJ SOLN: 4.0000 mg | Freq: Once | INTRAMUSCULAR | Status: DC | PRN

## 2015-08-15 MED ORDER — SODIUM CHLORIDE 0.9 % IV SOLN
8.0000 mg | Freq: Four times a day (QID) | INTRAVENOUS | Status: DC | PRN
  Filled 2015-08-15: qty 4

## 2015-08-15 MED ORDER — ROCURONIUM BROMIDE 50 MG/5ML IV SOLN
INTRAVENOUS | Status: AC
  Filled 2015-08-15: qty 1

## 2015-08-15 MED ORDER — HYDROMORPHONE HCL 1 MG/ML IJ SOLN
1.0000 mg | INTRAMUSCULAR | Status: DC | PRN
  Administered 2015-08-15: 1 mg via INTRAVENOUS
  Administered 2015-08-15: 2 mg via INTRAVENOUS
  Filled 2015-08-15: qty 2
  Filled 2015-08-15: qty 1
  Filled 2015-08-15: qty 2

## 2015-08-15 MED ORDER — LIDOCAINE HCL 1 % IJ SOLN
INTRAMUSCULAR | Status: DC | PRN
  Administered 2015-08-15: 30 mg via INTRADERMAL

## 2015-08-15 MED ORDER — EPHEDRINE SULFATE 50 MG/ML IJ SOLN
INTRAMUSCULAR | Status: AC
Start: 2015-08-15 — End: 2015-08-15
  Filled 2015-08-15: qty 1

## 2015-08-15 MED ORDER — LACTATED RINGERS IV SOLN
INTRAVENOUS | Status: DC
  Administered 2015-08-15 (×2): via INTRAVENOUS
  Administered 2015-08-15: 1000 mL via INTRAVENOUS

## 2015-08-15 MED ORDER — BUPIVACAINE-EPINEPHRINE (PF) 0.5% -1:200000 IJ SOLN
INTRAMUSCULAR | Status: DC | PRN
  Administered 2015-08-15: 28 mL

## 2015-08-15 MED ORDER — FENTANYL CITRATE (PF) 250 MCG/5ML IJ SOLN
INTRAMUSCULAR | Status: AC
  Filled 2015-08-15: qty 25

## 2015-08-15 MED ORDER — CEFAZOLIN SODIUM-DEXTROSE 2-3 GM-% IV SOLR
2.0000 g | INTRAVENOUS | Status: AC
  Administered 2015-08-15: 2 g via INTRAVENOUS
  Filled 2015-08-15: qty 50

## 2015-08-15 MED ORDER — MIDAZOLAM HCL 5 MG/5ML IJ SOLN
INTRAMUSCULAR | Status: DC | PRN
  Administered 2015-08-15: 2 mg via INTRAVENOUS

## 2015-08-15 MED ORDER — GLYCOPYRROLATE 0.2 MG/ML IJ SOLN
INTRAMUSCULAR | Status: AC
  Filled 2015-08-15: qty 2

## 2015-08-15 MED ORDER — PROPOFOL 10 MG/ML IV BOLUS
INTRAVENOUS | Status: DC | PRN
  Administered 2015-08-15: 140 mg via INTRAVENOUS

## 2015-08-15 MED ORDER — GLYCOPYRROLATE 0.2 MG/ML IJ SOLN
INTRAMUSCULAR | Status: AC
Start: 2015-08-15 — End: 2015-08-15
  Filled 2015-08-15: qty 4

## 2015-08-15 MED ORDER — FENTANYL CITRATE (PF) 100 MCG/2ML IJ SOLN
50.0000 ug | Freq: Once | INTRAMUSCULAR | Status: AC
  Administered 2015-08-15: 50 ug via INTRAVENOUS

## 2015-08-15 MED ORDER — ONDANSETRON HCL 4 MG PO TABS
8.0000 mg | ORAL_TABLET | Freq: Four times a day (QID) | ORAL | Status: DC | PRN
  Administered 2015-08-15 (×2): 8 mg via ORAL
  Filled 2015-08-15 (×2): qty 2

## 2015-08-15 MED ORDER — NEOSTIGMINE METHYLSULFATE 10 MG/10ML IV SOLN
INTRAVENOUS | Status: DC | PRN
  Administered 2015-08-15 (×2): 2 mg via INTRAVENOUS

## 2015-08-15 MED ORDER — LEVOFLOXACIN IN D5W 750 MG/150ML IV SOLN
750.0000 mg | Freq: Once | INTRAVENOUS | Status: AC
  Administered 2015-08-15: 750 mg via INTRAVENOUS
  Filled 2015-08-15: qty 150

## 2015-08-15 MED ORDER — ONDANSETRON HCL 4 MG/2ML IJ SOLN
INTRAMUSCULAR | Status: AC
  Filled 2015-08-15: qty 2

## 2015-08-15 MED ORDER — ROCURONIUM BROMIDE 100 MG/10ML IV SOLN
INTRAVENOUS | Status: DC | PRN
  Administered 2015-08-15: 5 mg via INTRAVENOUS
  Administered 2015-08-15: 30 mg via INTRAVENOUS
  Administered 2015-08-15 (×2): 5 mg via INTRAVENOUS

## 2015-08-15 MED ORDER — DEXAMETHASONE SODIUM PHOSPHATE 4 MG/ML IJ SOLN
INTRAMUSCULAR | Status: AC
  Filled 2015-08-15: qty 1

## 2015-08-15 MED ORDER — SENNOSIDES-DOCUSATE SODIUM 8.6-50 MG PO TABS: 1.0000 | ORAL_TABLET | Freq: Every evening | ORAL | Status: DC | PRN

## 2015-08-15 MED ORDER — DOCUSATE SODIUM 100 MG PO CAPS
100.0000 mg | ORAL_CAPSULE | Freq: Two times a day (BID) | ORAL | Status: DC
  Administered 2015-08-15 – 2015-08-16 (×3): 100 mg via ORAL
  Filled 2015-08-15 (×3): qty 1

## 2015-08-15 MED ORDER — SODIUM CHLORIDE 0.9 % IR SOLN
Status: DC | PRN
  Administered 2015-08-15: 3000 mL

## 2015-08-15 MED ORDER — ZOLPIDEM TARTRATE 5 MG PO TABS
5.0000 mg | ORAL_TABLET | Freq: Every evening | ORAL | Status: DC | PRN
  Administered 2015-08-15: 5 mg via ORAL
  Filled 2015-08-15: qty 1

## 2015-08-15 MED ORDER — 0.9 % SODIUM CHLORIDE (POUR BTL) OPTIME
TOPICAL | Status: DC | PRN
  Administered 2015-08-15: 1000 mL

## 2015-08-15 MED ORDER — GLYCOPYRROLATE 0.2 MG/ML IJ SOLN
INTRAMUSCULAR | Status: DC | PRN
  Administered 2015-08-15: 0.6 mg via INTRAVENOUS

## 2015-08-15 MED ORDER — FENTANYL CITRATE (PF) 100 MCG/2ML IJ SOLN
INTRAMUSCULAR | Status: DC | PRN
  Administered 2015-08-15: 50 ug via INTRAVENOUS
  Administered 2015-08-15 (×2): 100 ug via INTRAVENOUS
  Administered 2015-08-15 (×2): 50 ug via INTRAVENOUS

## 2015-08-15 MED ORDER — FENTANYL CITRATE (PF) 100 MCG/2ML IJ SOLN
25.0000 ug | INTRAMUSCULAR | Status: DC | PRN
  Administered 2015-08-15: 50 ug via INTRAVENOUS

## 2015-08-15 MED ORDER — KCL IN DEXTROSE-NACL 20-5-0.45 MEQ/L-%-% IV SOLN
INTRAVENOUS | Status: DC
  Administered 2015-08-15: 13:00:00 via INTRAVENOUS

## 2015-08-15 MED ORDER — MIDAZOLAM HCL 2 MG/2ML IJ SOLN
INTRAMUSCULAR | Status: AC
  Filled 2015-08-15: qty 2

## 2015-08-15 MED ORDER — MIDAZOLAM HCL 2 MG/2ML IJ SOLN
INTRAMUSCULAR | Status: AC
  Filled 2015-08-15: qty 4

## 2015-08-15 MED ORDER — LIDOCAINE HCL (PF) 1 % IJ SOLN
INTRAMUSCULAR | Status: AC
  Filled 2015-08-15: qty 5

## 2015-08-15 MED ORDER — PROPOFOL 10 MG/ML IV BOLUS
INTRAVENOUS | Status: AC
  Filled 2015-08-15: qty 20

## 2015-08-15 MED ORDER — MIDAZOLAM HCL 2 MG/2ML IJ SOLN
1.0000 mg | INTRAMUSCULAR | Status: DC | PRN
  Administered 2015-08-15: 2 mg via INTRAVENOUS

## 2015-08-15 MED ORDER — OXYCODONE-ACETAMINOPHEN 5-325 MG PO TABS
1.0000 | ORAL_TABLET | ORAL | Status: DC | PRN
  Administered 2015-08-15 (×2): 1 via ORAL
  Administered 2015-08-16: 2 via ORAL
  Administered 2015-08-16: 1 via ORAL
  Filled 2015-08-15: qty 2
  Filled 2015-08-15 (×3): qty 1

## 2015-08-15 MED ORDER — BISACODYL 10 MG RE SUPP: 10.0000 mg | Freq: Every day | RECTAL | Status: DC | PRN

## 2015-08-15 MED ORDER — NEOSTIGMINE METHYLSULFATE 10 MG/10ML IV SOLN
INTRAVENOUS | Status: AC
  Filled 2015-08-15: qty 1

## 2015-08-15 SURGICAL SUPPLY — 40 items
APPLIER CLIP 13 LRG OPEN (CLIP) ×2
APR CLP LRG 13 20 CLIP (CLIP) ×1
BAG HAMPER (MISCELLANEOUS) ×2 IMPLANT
CLIP APPLIE 13 LRG OPEN (CLIP) IMPLANT
CLOTH BEACON ORANGE TIMEOUT ST (SAFETY) ×2 IMPLANT
COVER LIGHT HANDLE STERIS (MISCELLANEOUS) ×4 IMPLANT
DECANTER SPIKE VIAL GLASS SM (MISCELLANEOUS) ×2 IMPLANT
DRAPE PROXIMA HALF (DRAPES) ×2 IMPLANT
DRAPE STERI URO 9X17 APER PCH (DRAPES) ×2 IMPLANT
ELECT REM PT RETURN 9FT ADLT (ELECTROSURGICAL) ×2
ELECTRODE REM PT RTRN 9FT ADLT (ELECTROSURGICAL) ×1 IMPLANT
FORMALIN 10 PREFIL 480ML (MISCELLANEOUS) ×2 IMPLANT
GAUZE PACKING 2X5 YD STRL (GAUZE/BANDAGES/DRESSINGS) IMPLANT
GLOVE BIOGEL PI IND STRL 7.0 (GLOVE) IMPLANT
GLOVE BIOGEL PI IND STRL 7.5 (GLOVE) IMPLANT
GLOVE BIOGEL PI IND STRL 8 (GLOVE) ×1 IMPLANT
GLOVE BIOGEL PI INDICATOR 7.0 (GLOVE) ×2
GLOVE BIOGEL PI INDICATOR 7.5 (GLOVE) ×1
GLOVE BIOGEL PI INDICATOR 8 (GLOVE) ×1
GLOVE ECLIPSE 6.5 STRL STRAW (GLOVE) ×2 IMPLANT
GLOVE ECLIPSE 8.0 STRL XLNG CF (GLOVE) ×2 IMPLANT
GOWN STRL REUS W/TWL LRG LVL3 (GOWN DISPOSABLE) ×6 IMPLANT
GOWN STRL REUS W/TWL XL LVL3 (GOWN DISPOSABLE) ×2 IMPLANT
IV NS IRRIG 3000ML ARTHROMATIC (IV SOLUTION) ×2 IMPLANT
KIT BLADEGUARD II DBL (SET/KITS/TRAYS/PACK) ×1 IMPLANT
KIT ROOM TURNOVER AP CYSTO (KITS) ×2 IMPLANT
MANIFOLD NEPTUNE II (INSTRUMENTS) ×2 IMPLANT
NEEDLE HYPO 22GX1.5 SAFETY (NEEDLE) ×2 IMPLANT
NS IRRIG 1000ML POUR BTL (IV SOLUTION) ×2 IMPLANT
PACK PERI GYN (CUSTOM PROCEDURE TRAY) ×2 IMPLANT
PAD ARMBOARD 7.5X6 YLW CONV (MISCELLANEOUS) ×2 IMPLANT
SET BASIN LINEN APH (SET/KITS/TRAYS/PACK) ×2 IMPLANT
SUT MNCRL+ AB 3-0 CT1 36 (SUTURE) IMPLANT
SUT MON AB 3-0 SH 27 (SUTURE) IMPLANT
SUT MONOCRYL AB 3-0 CT1 36IN (SUTURE) ×1
SUT VIC AB 0 CT1 27 (SUTURE) ×8
SUT VIC AB 0 CT1 27XCR 8 STRN (SUTURE) ×2 IMPLANT
SYR CONTROL 10ML LL (SYRINGE) ×2 IMPLANT
TRAY FOLEY CATH SILVER 16FR (SET/KITS/TRAYS/PACK) ×2 IMPLANT
VERSALIGHT (MISCELLANEOUS) ×2 IMPLANT

## 2015-08-15 NOTE — Anesthesia Procedure Notes (Signed)
Procedure Name: Intubation Date/Time: 08/15/2015 8:40 AM Performed by: Despina HiddenIDACAVAGE, ROBERT J Pre-anesthesia Checklist: Patient identified, Emergency Drugs available, Suction available and Patient being monitored Patient Re-evaluated:Patient Re-evaluated prior to inductionOxygen Delivery Method: Circle system utilized Preoxygenation: Pre-oxygenation with 100% oxygen Intubation Type: IV induction Ventilation: Mask ventilation without difficulty Laryngoscope Size: Mac and 3 Grade View: Grade I Tube type: Oral Tube size: 7.0 mm Number of attempts: 1 Airway Equipment and Method: Stylet Placement Confirmation: ETT inserted through vocal cords under direct vision,  positive ETCO2 and breath sounds checked- equal and bilateral Secured at: 22 cm Tube secured with: Tape Dental Injury: Teeth and Oropharynx as per pre-operative assessment

## 2015-08-15 NOTE — Transfer of Care (Signed)
Immediate Anesthesia Transfer of Care Note  Patient: Susan Wheeler  Procedure(s) Performed: Procedure(s): HYSTERECTOMY VAGINAL (N/A)  Patient Location: PACU  Anesthesia Type:General  Level of Consciousness: awake and patient cooperative  Airway & Oxygen Therapy: Patient Spontanous Breathing and Patient connected to face mask oxygen  Post-op Assessment: Report given to RN, Post -op Vital signs reviewed and stable and Patient moving all extremities  Post vital signs: Reviewed and stable  Last Vitals:  Filed Vitals:   08/15/15 0815  BP: 122/90  Pulse:   Temp:   Resp: 19    Complications: No apparent anesthesia complications

## 2015-08-15 NOTE — Op Note (Signed)
Preoperative diagnosis:  1.  Dyspareunia, bump                                         2.  menometrorrhagia                                         3.  dysmenorrhea                                         4.    Postoperative diagnosis:  Same as above + minimal endometriosis  Procedure:  Vaginal hysterectomy  Surgeon:  Lazaro ArmsLuther H Eure MD  Anesthesia:  General Endotracheal  Findings:  Minimal endometriosis in the posterior cul de sac, uterus consistent with adenomyosis, ovaries are normal  Intraoperative findings were noted above  Description of operation:  The patient was taken from the preoperative area to the operating room in stable condition. She was placed in the sitting position and underwent a spinal anesthetic. Once an adequate level of anesthesia was attained she was placed in the dorsal lithotomy position. Patient was prepped and draped in the usual sterile fashion and a Foley catheter was placed.  A weighted speculum was placed and the cervix was grasped with thyroid tenaculums both anteriorly and posteriorly.  0.5% Marcaine plain was injected in a circumferential fashion about the cervix and the electrocautery unit was used to incise the vagina and push at all cervix.  The posterior cul-de-sac was then entered sharply without difficulty.  The uterosacral ligaments were clamped cut and inspection suture ligated and held.  The cardinal ligaments were then clamped cut transfixion suture ligated and cut. The anterior peritoneum was identified the anterior cul-de-sac was entered sharply without difficulty. The anterior and posterior leaves of the broad ligament were plicated and the uterine vessels were clamped cut and suture ligated. Serial pedicles were taken of the fundus with each pedicle being clamped cut and suture ligated. The utero-ovarian ligaments were crossclamped the uterus was removed and both pedicles were transfixion suture ligated. There was good hemostasis of all the pedicles.  The peritoneum was then closed in a pursestring fashion using 3-0 Vicryl. The anterior posterior vagina was closed in interrupted fashion with good resultant hemostasis. Our closure the lower pelvis and vagina were irrigated vigorously.  The sponge needle and instrument counts were correct x 3.  Total blood loss for the procedure was 300 cc.  The patient received 2 g of Ancef and 30 mg of Toradol IV preoperatively prophylactically.  She was taken to the recovery room in good stable condition awake alert doing well.  EURE,LUTHER H 08/15/2015, 10:52 AM

## 2015-08-15 NOTE — Progress Notes (Addendum)
OK to infuse ancef even though has pcn allergy per Dr Despina HiddenEure.

## 2015-08-15 NOTE — Anesthesia Postprocedure Evaluation (Signed)
  Anesthesia Post-op Note  Patient: Susan Wheeler  Procedure(s) Performed: Procedure(s): HYSTERECTOMY VAGINAL (N/A)  Patient Location: PACU  Anesthesia Type:General  Level of Consciousness: awake, alert , oriented and patient cooperative  Airway and Oxygen Therapy: Patient Spontanous Breathing  Post-op Pain: 4 /10, moderate  Post-op Assessment: Post-op Vital signs reviewed, Patient's Cardiovascular Status Stable, Respiratory Function Stable, Patent Airway, No signs of Nausea or vomiting, Pain level controlled and No headache              Post-op Vital Signs: Reviewed and stable  Last Vitals:  Filed Vitals:   08/15/15 1043  BP:   Pulse:   Temp: 36.8 C  Resp:     Complications: No apparent anesthesia complications

## 2015-08-15 NOTE — Anesthesia Preprocedure Evaluation (Addendum)
Anesthesia Evaluation  Patient identified by MRN, date of birth, ID band  Reviewed: Allergy & Precautions, H&P , NPO status , Patient's Chart, lab work & pertinent test results  Airway Mallampati: I  TM Distance: >3 FB     Dental  (+) Teeth Intact   Pulmonary neg pulmonary ROS,    breath sounds clear to auscultation       Cardiovascular negative cardio ROS   Rhythm:Regular Rate:Normal     Neuro/Psych PSYCHIATRIC DISORDERS Anxiety    GI/Hepatic neg GERD  ,  Endo/Other    Renal/GU      Musculoskeletal   Abdominal   Peds  Hematology   Anesthesia Other Findings   Reproductive/Obstetrics                            Anesthesia Physical Anesthesia Plan  ASA: II  Anesthesia Plan: General   Post-op Pain Management:    Induction: Intravenous  Airway Management Planned: Oral ETT  Additional Equipment:   Intra-op Plan:   Post-operative Plan: Extubation in OR  Informed Consent: I have reviewed the patients History and Physical, chart, labs and discussed the procedure including the risks, benefits and alternatives for the proposed anesthesia with the patient or authorized representative who has indicated his/her understanding and acceptance.     Plan Discussed with:   Anesthesia Plan Comments:         Anesthesia Quick Evaluation

## 2015-08-15 NOTE — H&P (Signed)
Preoperative History and Physical  Susan Wheeler is a 39 y.o. G3P3 with Patient's last menstrual period was 06/07/2015. admitted for a vaginal hysterectomy.  She has been having increasing difficulty over the past several years with heavy painful periods and sonogram reveals an endometrial polyp. However she has also been experiencing increased issues with bump dyspareunia confirmed at the time of her previous exam. To take care of all of these problems she has opted for [facility]  PMH:  Past Medical History  Diagnosis Date  . Anxiety   . GERD (gastroesophageal reflux disease)   . Dyspareunia 04/25/2015  . Vaginal itching 04/25/2015  . Vaginal discharge 04/25/2015  . BV (bacterial vaginosis) 04/25/2015    PSH:  Past Surgical History  Procedure Laterality Date  . Tubal ligation    . Mandible surgery    . Knee arthroscopy Left   . Cholecystectomy N/A 11/06/2014    Procedure: LAPAROSCOPIC CHOLECYSTECTOMY; Surgeon: Dalia Heading, MD; Location: [facility]; Service: General; Laterality: N/A;    POb/GynH:  OB History    Gravida Para Term Preterm AB TAB SAB Ectopic Multiple Living   SH:  Social History  Substance Use Topics  . Smoking status: Never Smoker   . Smokeless tobacco: Never Used  . Alcohol Use: No    FH:  Family History  Problem Relation Age of Onset  . Migraines Mother   . Asthma Daughter   . Asthma Son   . Cancer Maternal Grandmother   . COPD Maternal Grandfather   . Asthma Son      Allergies:  Allergies  Allergen Reactions  . Codeine Hives  . Penicillins Hives and Nausea And Vomiting    Medications:  Current outpatient prescriptions:  . doxycycline (VIBRAMYCIN) 100 MG capsule, Take 1 capsule (100 mg total) by mouth 2 (two) times daily. (Patient not taking: Reported on 05/21/2015), Disp: 20 capsule,  Rfl: 0 . erythromycin ophthalmic ointment, Place 1 application into the right eye 3 (three) times daily. Filled 04/06/15, Disp: , Rfl:  . fluconazole (DIFLUCAN) 150 MG tablet, Take 1 before period each month as needed (Patient not taking: Reported on 05/21/2015), Disp: 1 tablet, Rfl: 3 . ibuprofen (ADVIL,MOTRIN) 600 MG tablet, Take 1 tablet (600 mg total) by mouth every 6 (six) hours as needed. (Patient not taking: Reported on 07/10/2015), Disp: 30 tablet, Rfl: 0 . metroNIDAZOLE (FLAGYL) 500 MG tablet, Take 1 tablet (500 mg total) by mouth 2 (two) times daily. (Patient not taking: Reported on 06/26/2015), Disp: 14 tablet, Rfl: 0 . valACYclovir (VALTREX) 1000 MG tablet, Take 1 tablet (1,000 mg total) by mouth 2 (two) times daily. (Patient not taking: Reported on 05/21/2015), Disp: 20 tablet, Rfl: 1  Review of Systems:   Review of Systems  Constitutional: Negative for fever, chills, weight loss, malaise/fatigue and diaphoresis.  HENT: Negative for hearing loss, ear pain, nosebleeds, congestion, sore throat, neck pain, tinnitus and ear discharge.  Eyes: Negative for blurred vision, double vision, photophobia, pain, discharge and redness.  Respiratory: Negative for cough, hemoptysis, sputum production, shortness of breath, wheezing and stridor.  Cardiovascular: Negative for chest pain, palpitations, orthopnea, claudication, leg swelling and PND.  Gastrointestinal: Positive for abdominal pain. Negative for heartburn, nausea, vomiting, diarrhea, constipation, blood in stool and melena.  Genitourinary: Negative for dysuria, urgency, frequency, hematuria and flank pain.  Musculoskeletal: Negative for myalgias, back pain, joint pain and falls.  Skin: Negative for itching and  rash.  Neurological: Negative for dizziness, tingling, tremors, sensory change, speech change, focal weakness, seizures, loss of consciousness, weakness and headaches.  Endo/Heme/Allergies: Negative for environmental allergies and  polydipsia. Does not bruise/bleed easily.  Psychiatric/Behavioral: Negative for depression, suicidal ideas, hallucinations, memory loss and substance abuse. The patient is not nervous/anxious and does not have insomnia.     PHYSICAL EXAM:  Blood pressure 110/80, pulse 72, weight 177 lb (80.287 kg), last menstrual period 06/07/2015.   Vitals reviewed. Constitutional: She is oriented to person, place, and time. She appears well-developed and well-nourished.  HENT:  Head: Normocephalic and atraumatic.  Right Ear: External ear normal.  Left Ear: External ear normal.  Nose: Nose normal.  Mouth/Throat: Oropharynx is clear and moist.  Eyes: Conjunctivae and EOM are normal. Pupils are equal, round, and reactive to light. Right eye exhibits no discharge. Left eye exhibits no discharge. No scleral icterus.  Neck: Normal range of motion. Neck supple. No tracheal deviation present. No thyromegaly present.  Cardiovascular: Normal rate, regular rhythm, normal heart sounds and intact distal pulses. Exam reveals no gallop and no friction rub.  No murmur heard. Respiratory: Effort normal and breath sounds normal. No respiratory distress. She has no wheezes. She has no rales. She exhibits no tenderness.  GI: Soft. Bowel sounds are normal. She exhibits no distension and no mass. There is tenderness. There is no rebound and no guarding.  Genitourinary:   Vulva is normal without lesions Vagina is pink moist without discharge Cervix normal in appearance and pap is normal, + pain with cervical manipulation similar to intercourse manipulation Uterus is normal size, contour, position, consistency, mobility, non-tender Adnexa is negative with normal sized ovaries by sonogram  Musculoskeletal: Normal range of motion. She exhibits no edema and no tenderness.  Neurological: She is alert and oriented to person, place, and time. She has normal reflexes. She displays normal reflexes. No cranial nerve  deficit. She exhibits normal muscle tone. Coordination normal.  Skin: Skin is warm and dry. No rash noted. No erythema. No pallor.  Psychiatric: She has a normal mood and affect. Her behavior is normal. Judgment and thought content normal.    Labs: No results found for this or any previous visit (from the past 336 hour(s)).  EKG: No orders found for this or any previous visit.  Imaging Studies:  Imaging Results    Us Transvaginal Non-ob  07/10/2015 GYNECOLOGIC SONOGRAM Abrea S Purdy is a 38 y.o. G3P3 LMP 06/07/2015 for a pelvic sonogram for menorrhagia,dysmenorrhea, and enlarged uterus. Uterus 10.29 x 7.7 x 5.47 cm, normal anteverted uterus Endometrium  15 mm, symmetrical, EEC 30BacoEliberto Ivory62Juliene Marland KitcheVeterans Administration Medica808-20mDeEliberto Ivory8Juliene Marland KitcheHazleton Surgery [phone] 1.2cm echogenic endometrial mass w/color flow ? polyp Right ovary 2.03 x 1.8 x 2.2 cm, wnl Left ovary 1.8 x 1.7 x 2.1 cm, wnl Technician Comments: US PELVIS TA/TV: normal anteverted uterus,normal ov 's bil2mSuEliberto Ivory794 E.Juliene Marland KitcheSan Gorgonio Memorial 9722mRancho Mission VEliberto Ivory7672 Juliene Marland KitcheArkansas State [phone] fluid seen, EEC 35New Vision Surgical Center LLCDuaQueen SloIllinoiAncSouth Nassau Communities HK63mRussiavEliberto Ivory95Juliene Marland KitcheGalion Community 406mFruitEliberto Ivory7260 Juliene Marland KitcheSalem [phone] CEliberto Ivory9144 EasJuliene Marland KitcheRaleigh Endoscopy [phone]([phone] County Memorial HospitalOregQueen SloIllinoiAncMclaren PoKentu15mEttEliberto IvorJuliene Marland KitcheMercy Medical Center West [phone] Surgery Center LLCSQueen SloIllinoiAncKaweah Delta 2mPeEliberto Ivory6Juliene Marland KitcheDale [phone] Regional Medical CenterShQueen S82mTimbervEliberto Ivory43Juliene Marland KitcheCumberland Valley Surger709-28mJurupa VaEliberto Ivory6Juliene Marland KitcheWilcox Memorial [phone] Ivory503 NorJuliene Marland KitcheKearney Ambulatory Surgical Center LLC Dba Heartland [phone] Eliberto Ivory7Juliene Marland KitcheJackson Memorial Mental Health Center - I754-97Eliberto IMarland KitchenvEliza Coffee Memorial Hospitalaworgeookeville SurgKentuckyerKentuckyy [phone] Medical CenteQueen SloIllinoiAncSpringh38mAEliberto Ivory98 PJuliene Marland KitcheNew England Baptist [phone] AntEliberto Ivory116Juliene Marland KitcheSan Juan [phone] [phone] Medical CenterQueen SloIllinoiAncThe Hand CenKentuckyt9([place]entucky8([phone] Plains Medical CenterQueen SloIllinoiAncCypress Gr80mNew LoEliberto Ivory4Juliene Marland KitcheKindred Hospital - Denv586 66mReaEliberto Ivory955Juliene Marland KitcheAnne Arundel [phone]([phone] West Endoscopy Center LLQueen SloIllinoiAncPeterson Rehabilitation Ken[phone] Valley HospitQueen SloIllinoiAncSoutheastKentuck37mEEliberto Ivory150 BJuliene Marland KitcheClearview Surgery [phone] Scott White Surgicare PlanoTQueen SloIllinoiAncEye Surgicenter Of NeKentuck4mRoyal Palm EstEliberto Ivory855Juliene Marland KitcheGood Hope [phone] Community HospitalBlaQueen SloIllinoiAncSt Vincent Charity MedicaKentuckyl [place]4279Leon63mConey IsEliberto Ivory9709 Juliene Marland KitcheInova Loudoun [phone] Behavioral HospitalNew Queen SloIllinoiAncGuthrie Towanda Memorial Ken[phone] Center Of Key West LLCBQueen SloIllinoiAncMemorial Hermann Surgical Hospital FirstKentucky 7(7[place]([phone] Andrews Health Center - CahQueen SloIllinoiAncSog Surgery KentuckyenKentucky([phone] Sake941270.7 x 1.2cm echogenic endometrial mass w/color flow ? polyp Amber J Carl 07/10/2015 12:22 PM Clinical Impression and recommendations: I have reviewed the sonogram results above, combined with the patient's current clinical course, below are my impressions and any appropriate recommendations for management based on the sonographic findings. Endometrium with 1.7 cm endometrial polyp Uterus is normal Both ovaries are normal EURE,LUTHER H 07/10/2015 2:27 PM   Us Pelvis Complete  07/10/2015 GYNECOLOGIC SONOGRAM Cassandria S Purdy is a 38 y.o. G3P3 LMP 06/07/2015 for a pelvic sonogram for menorrhagia,dysmenorrhea, and enlarged uterus. Uterus 10.29 x 7.7 x 5.47  cm, normal anteverted uterus Endometrium  15 mm, symmetrical, EEC 15mm w/a 1.7 x .7 x 1.2cm echogenic endometrial mass w/color flow ? polyp Right ovary 2.03 x 1.8 x 2.2 cm, wnl  Left ovary 1.8 x 1.7 x 2.1 cm, wnl Technician Comments: US PELVIS TA/TV: normal anteverted uterus,normal ov 's bilat (mobile),no free fluid seen, EEC 15mm w/a 1.7 x .7 x 1.2cm echogenic endometrial mass w/color flow ? polyp Karie Chimera 07/10/2015 12:22 PM Clinical Impression and recommendations: I have reviewed the sonogram results above, combined with the patient's current clinical course, below are my impressions and any appropriate recommendations for management based on the sonographic findings. Endometrium with 1.7 cm endometrial polyp Uterus is normal Both ovaries are normal EURE,LUTHER H 07/10/2015 2:27 PM       Assessment: Dysmenorrhea Dyspareunia menorrhagia   Patient Active Problem List   Diagnosis Date Noted  . Dyspareunia 04/25/2015  . Vaginal itching 04/25/2015  . Vaginal discharge 04/25/2015  . BV (bacterial vaginosis) 04/25/2015  . NAUSEA 01/22/2009  . ABDOMINAL PAIN 01/22/2009  . RUQ PAIN 01/22/2009    Plan: Pt has on going pain with intercourse with interferes with her sexual frequency and experience  Given the choice between a hysteroscopy uterine curettage with polyp removal and endometrial ablation vs a vaginal hysterectomy to address the dyspareunia as well, despite the differences in the perioperative and intra op risk, she decides on the vaginal hysterectomy  It is scheduled for 08/15/2015  Pt understands the risks of surgery including but not limited t excessive bleeding requiring transfusion or reoperation, post-operative infection requiring prolonged hospitalization or re-hospitalization and antibiotic therapy, and damage to other organs including bladder, bowel, ureters and major vessels. The patient also understands the alternative treatment options which were discussed in full. All questions were answered.

## 2015-08-16 ENCOUNTER — Encounter (HOSPITAL_COMMUNITY): Payer: Self-pay | Admitting: Obstetrics & Gynecology

## 2015-08-16 DIAGNOSIS — N921 Excessive and frequent menstruation with irregular cycle: Secondary | ICD-10-CM | POA: Diagnosis not present

## 2015-08-16 LAB — BASIC METABOLIC PANEL
Anion gap: 4 — ABNORMAL LOW (ref 5–15)
BUN: 7 mg/dL (ref 6–20)
CO2: 25 mmol/L (ref 22–32)
Calcium: 8.2 mg/dL — ABNORMAL LOW (ref 8.9–10.3)
Chloride: 107 mmol/L (ref 101–111)
Creatinine, Ser: 0.9 mg/dL (ref 0.44–1.00)
GFR calc Af Amer: >60 mL/min (ref >60)
GFR calc non Af Amer: >60 mL/min (ref >60)
Glucose, Bld: 125 mg/dL — ABNORMAL HIGH (ref 65–99)
Potassium: 4.3 mmol/L (ref 3.5–5.1)
Sodium: 136 mmol/L (ref 135–145)

## 2015-08-16 LAB — CBC
HCT: 29 % — ABNORMAL LOW (ref 36.0–46.0)
Hemoglobin: 9.5 g/dL — ABNORMAL LOW (ref 12.0–15.0)
MCH: 26.6 pg (ref 26.0–34.0)
MCHC: 32.8 g/dL (ref 30.0–36.0)
MCV: 81.2 fL (ref 78.0–100.0)
Platelets: 224 10*3/uL (ref 150–400)
RBC: 3.57 MIL/uL — ABNORMAL LOW (ref 3.87–5.11)
RDW: 13.9 % (ref 11.5–15.5)
WBC: 8.3 10*3/uL (ref 4.0–10.5)

## 2015-08-16 MED ORDER — OXYCODONE-ACETAMINOPHEN 5-325 MG PO TABS: 1.0000 | ORAL_TABLET | ORAL | Status: DC | PRN

## 2015-08-16 MED ORDER — KETOROLAC TROMETHAMINE 10 MG PO TABS: 10.0000 mg | ORAL_TABLET | Freq: Three times a day (TID) | ORAL | Status: DC | PRN

## 2015-08-16 MED ORDER — CIPROFLOXACIN HCL 500 MG PO TABS: 500.0000 mg | ORAL_TABLET | Freq: Two times a day (BID) | ORAL | Status: DC

## 2015-08-16 MED ORDER — ONDANSETRON HCL 8 MG PO TABS: 8.0000 mg | ORAL_TABLET | Freq: Four times a day (QID) | ORAL | Status: DC | PRN

## 2015-08-16 NOTE — Progress Notes (Signed)
Patient discharged home with instructions given on medications,and follow up visits,patient verbalized understanding. Vital signs stable. Accompanied by staff to an awaiting vehicle.

## 2015-08-16 NOTE — Discharge Instructions (Signed)
Abdominal Hysterectomy, Care After °Refer to this sheet in the next few weeks. These instructions provide you with information on caring for yourself after your procedure. Your health care provider may also give you more specific instructions. Your treatment has been planned according to current medical practices, but problems sometimes occur. Call your health care provider if you have any problems or questions after your procedure.  °WHAT TO EXPECT AFTER THE PROCEDURE °After your procedure, it is typical to have the following: °· Pain. °· Feeling tired. °· Poor appetite. °· Less interest in sex. °It takes 4-6 weeks to recover from this surgery.  °HOME CARE INSTRUCTIONS  °· Take pain medicines only as directed by your health care provider. Do not take over-the-counter pain medicines without checking with your health care provider first.  °· Change your bandage as directed by your health care provider. °· Return to your health care provider to have your sutures taken out. °· Take showers instead of baths for 2-3 weeks. Ask your health care provider when it is safe to start showering.  °· Do not douche, use tampons, or have sexual intercourse for at least 6 weeks or until your health care provider says you can.   °· Follow your health care provider's advice about exercise, lifting, driving, and general activities. °· Get plenty of rest and sleep.   °· Do not lift anything heavier than a gallon of milk (about 10 lb [4.5 kg]) for the first month after surgery. °· You can resume your normal diet if your health care provider says it is okay.   °· Do not drink alcohol until your health care provider says you can.   °· If you are constipated, ask your health care provider if you can take a mild laxative. °· Eating foods high in fiber may also help with constipation. Eat plenty of raw fruits and vegetables, whole grains, and beans. °· Drink enough fluids to keep your urine clear or pale yellow.   °· Try to have someone at  home with you for the first 1-2 weeks to help around the house. °· Keep all follow-up appointments. °SEEK MEDICAL CARE IF:  °· You have chills or fever. °· You have swelling, redness, or pain in the area of your incision that is getting worse.   °· You have pus coming from the incision.   °· You notice a bad smell coming from the incision or bandage.   °· Your incision breaks open.   °· You feel dizzy or light-headed.   °· You have pain or bleeding when you urinate.   °· You have persistent diarrhea.   °· You have persistent nausea and vomiting.   °· You have abnormal vaginal discharge.   °· You have a rash.   °· You have any type of abnormal reaction or develop an allergy to your medicine.   °· Your pain medicine is not helping.   °SEEK IMMEDIATE MEDICAL CARE IF:  °· You have a fever and your symptoms suddenly get worse. °· You have severe abdominal pain. °· You have chest pain. °· You have shortness of breath. °· You faint. °· You have pain, swelling, or redness of your leg. °· You have heavy vaginal bleeding with blood clots. °MAKE SURE YOU: °· Understand these instructions. °· Will watch your condition. °· Will get help right away if you are not doing well or get worse. °  °This information is not intended to replace advice given to you by your health care provider. Make sure you discuss any questions you have with your health care provider. °  °Document   Released: 05/09/2005 Document Revised: 11/10/2014 Document Reviewed: 08/12/2013 °Elsevier Interactive Patient Education ©2016 [employer] Inc. ° °

## 2015-08-16 NOTE — Care Management Note (Signed)
Case Management Note  Patient Details  Name: Susan Wheeler MRN: [id number] Date of Birth: 09/04/1976  Expected Discharge Date:   08/16/2015               Expected Discharge Plan:  Home/Self Care  In-House Referral:  NA  Discharge planning Services  CM Consult  Post Acute Care Choice:  NA Choice offered to:  NA  DME Arranged:    DME Agency:     HH Arranged:    HH Agency:     Status of Service:  Completed, signed off  Medicare Important Message Given:    Date Medicare IM Given:    Medicare IM give by:    Date Additional Medicare IM Given:    Additional Medicare Important Message give by:     If discussed at Long Length of Stay Meetings, dates discussed:    Additional Comments: Pt is from home, ind at baseline. Pt discharging home today with self care. No CM needs noted.  Malcolm Metrohildress, Jessica Demske, RN 08/16/2015, 12:09 PM

## 2015-08-16 NOTE — Discharge Summary (Signed)
Physician Discharge Summary  Patient ID: Susan Wheeler MRN: [id number] DOB/AGE: [birth year] 37 y.o.  Admit date: 08/15/2015 Discharge date: 08/16/2015  Admission Diagnoses: Dyspareunia, dysmenorrhea, menometrorrhagia  Discharge Diagnoses:  Active Problems:   S/P vaginal hysterectomy   Discharged Condition: good  Hospital Course: unremarkable  Consults: None  Significant Diagnostic Studies:   Treatments: surgery: TVH  Discharge Exam: Blood pressure 114/74, pulse 68, temperature 98 F (36.7 C), temperature source Oral, resp. rate 16, height  (1.626 m), weight 177 lb (80.287 kg), last menstrual period 08/06/2015, SpO2 100 %. General appearance: alert, cooperative and no distress GI: soft, non-tender; bowel sounds normal; no masses,  no organomegaly  Disposition: 01-Home or Self Care  Discharge Instructions    Call MD for:  persistant nausea and vomiting    Complete by:  As directed      Call MD for:  severe uncontrolled pain    Complete by:  As directed      Call MD for:  temperature >100.4    Complete by:  As directed      Diet - low sodium heart healthy    Complete by:  As directed      Driving Restrictions    Complete by:  As directed   No driving for 1 week     Increase activity slowly    Complete by:  As directed      Lifting restrictions    Complete by:  As directed   No more than 10 pounds for 6 weeks     No wound care    Complete by:  As directed      Sexual Activity Restrictions    Complete by:  As directed   You are kidding, right?            Medication List    STOP taking these medications        doxycycline 100 MG capsule  Commonly known as:  VIBRAMYCIN     fluconazole 150 MG tablet  Commonly known as:  DIFLUCAN     ibuprofen 600 MG tablet  Commonly known as:  ADVIL,MOTRIN     metroNIDAZOLE 500 MG tablet  Commonly known as:  FLAGYL      TAKE these medications        ciprofloxacin 500 MG tablet  Commonly known as:  CIPRO   Take 1 tablet (500 mg total) by mouth 2 (two) times daily.     ketorolac 10 MG tablet  Commonly known as:  TORADOL  Take 1 tablet (10 mg total) by mouth every 8 (eight) hours as needed.     ondansetron 8 MG tablet  Commonly known as:  ZOFRAN  Take 1 tablet (8 mg total) by mouth every 6 (six) hours as needed for nausea.     oxyCODONE-acetaminophen 5-325 MG tablet  Commonly known as:  PERCOCET/ROXICET  Take 1-2 tablets by mouth every 4 (four) hours as needed (moderate to severe pain (when tolerating fluids)).     valACYclovir 1000 MG tablet  Commonly known as:  VALTREX  Take 1 tablet (1,000 mg total) by mouth 2 (two) times daily.           Follow-up Information    Follow up with Lazaro Arms, MD On 08/22/2015.   Specialties:  Obstetrics and Gynecology, Radiology   Why:  post op visit already scheduled   Contact information:   [address] [place] [address] [phone]       Signed: Duane Lope  H 08/16/2015, 11:06 AM

## 2015-08-22 ENCOUNTER — Encounter: Payer: Self-pay | Admitting: Obstetrics & Gynecology

## 2015-08-22 ENCOUNTER — Ambulatory Visit (INDEPENDENT_AMBULATORY_CARE_PROVIDER_SITE_OTHER): Payer: Medicaid Other | Admitting: Obstetrics & Gynecology

## 2015-08-22 VITALS — BP 100/68 | HR 68 | Wt 172.0 lb

## 2015-08-22 DIAGNOSIS — Z9889 Other specified postprocedural states: Secondary | ICD-10-CM

## 2015-08-22 DIAGNOSIS — Z90710 Acquired absence of both cervix and uterus: Secondary | ICD-10-CM

## 2015-08-22 MED ORDER — ZOLPIDEM TARTRATE 10 MG PO TABS: 10.0000 mg | ORAL_TABLET | Freq: Every evening | ORAL | Status: DC | PRN

## 2015-08-22 NOTE — Progress Notes (Signed)
Patient ID: Susan Wheeler, female   DOB: 04/01/1976, 39 y.o.   MRN: [id number]  HPI: Patient returns for routine postoperative follow-up having undergone vaginal hysterectomy on 1 week ago.  The patient's immediate postoperative recovery has been unremarkable. Since hospital discharge the patient reports no bleeding minimal pain no problems.   Current Outpatient Prescriptions: ciprofloxacin (CIPRO) 500 MG tablet, Take 1 tablet (500 mg total) by mouth 2 (two) times daily., Disp: 14 tablet, Rfl: 0 Docusate Calcium (STOOL SOFTENER PO), Take by mouth., Disp: , Rfl:  ketorolac (TORADOL) 10 MG tablet, Take 1 tablet (10 mg total) by mouth every 8 (eight) hours as needed. (Patient not taking: Reported on 08/22/2015), Disp: 15 tablet, Rfl: 0 ondansetron (ZOFRAN) 8 MG tablet, Take 1 tablet (8 mg total) by mouth every 6 (six) hours as needed for nausea. (Patient not taking: Reported on 08/22/2015), Disp: 20 tablet, Rfl: 0 oxyCODONE-acetaminophen (PERCOCET/ROXICET) 5-325 MG tablet, Take 1-2 tablets by mouth every 4 (four) hours as needed (moderate to severe pain (when tolerating fluids)). (Patient not taking: Reported on 08/22/2015), Disp: 30 tablet, Rfl: 0 valACYclovir (VALTREX) 1000 MG tablet, Take 1 tablet (1,000 mg total) by mouth 2 (two) times daily. (Patient not taking: Reported on 05/21/2015), Disp: 20 tablet, Rfl: 1  No current facility-administered medications for this visit.    Blood pressure 100/68, pulse 68, weight 172 lb (78.019 kg), last menstrual period 08/10/2015.  Physical Exam: Cuff is intact with intact sutures no discharge in the vagina  Diagnostic Tests:   Pathology: Benign pathology  Impression: Normal postoperative course  Plan: No sex or heavy lifting  Follow up: 5  weeks  Lazaro ArmsEURE,LUTHER H, MD

## 2015-09-26 ENCOUNTER — Encounter: Payer: Self-pay | Admitting: Obstetrics & Gynecology

## 2015-09-26 ENCOUNTER — Ambulatory Visit (INDEPENDENT_AMBULATORY_CARE_PROVIDER_SITE_OTHER): Payer: Medicaid Other | Admitting: Obstetrics & Gynecology

## 2015-09-26 VITALS — BP 100/70 | HR 74 | Wt 173.0 lb

## 2015-09-26 DIAGNOSIS — Z9889 Other specified postprocedural states: Secondary | ICD-10-CM

## 2015-09-26 DIAGNOSIS — Z90710 Acquired absence of both cervix and uterus: Secondary | ICD-10-CM

## 2015-09-26 NOTE — Progress Notes (Signed)
Patient ID: Susan Wheeler, female   DOB: 04/01/1976, 39 y.o.   MRN: [id number]   Final post op visit after [facility] 08/15/2015 menorrhagia dysmenorrhea dyspareunia No complaints No bleeding No discharge No sex yet  Exam Normal healing vagina Some suture still present Bimanual still healing a bit No sex for 2 more weeks then take it easy for a while with a history of bump dyspareunia  Follow up prn or 1 year

## 2015-11-19 ENCOUNTER — Encounter (HOSPITAL_COMMUNITY): Payer: Self-pay | Admitting: *Deleted

## 2015-11-19 ENCOUNTER — Emergency Department (HOSPITAL_COMMUNITY): Payer: Medicaid Other

## 2015-11-19 ENCOUNTER — Emergency Department (HOSPITAL_COMMUNITY)
Admission: EM | Admit: 2015-11-19 | Discharge: 2015-11-19 | Disposition: A | Discharge status: Home / Self Care | Payer: Medicaid Other | Attending: Emergency Medicine | Admitting: Emergency Medicine

## 2015-11-19 DIAGNOSIS — Y998 Other external cause status: Secondary | ICD-10-CM | POA: Insufficient documentation

## 2015-11-19 DIAGNOSIS — Z8719 Personal history of other diseases of the digestive system: Secondary | ICD-10-CM | POA: Insufficient documentation

## 2015-11-19 DIAGNOSIS — Z872 Personal history of diseases of the skin and subcutaneous tissue: Secondary | ICD-10-CM | POA: Insufficient documentation

## 2015-11-19 DIAGNOSIS — Y9389 Activity, other specified: Secondary | ICD-10-CM | POA: Insufficient documentation

## 2015-11-19 DIAGNOSIS — S5002XA Contusion of left elbow, initial encounter: Secondary | ICD-10-CM | POA: Insufficient documentation

## 2015-11-19 DIAGNOSIS — Z8742 Personal history of other diseases of the female genital tract: Secondary | ICD-10-CM | POA: Insufficient documentation

## 2015-11-19 DIAGNOSIS — W000XXA Fall on same level due to ice and snow, initial encounter: Secondary | ICD-10-CM | POA: Insufficient documentation

## 2015-11-19 DIAGNOSIS — Z8659 Personal history of other mental and behavioral disorders: Secondary | ICD-10-CM | POA: Insufficient documentation

## 2015-11-19 DIAGNOSIS — Y9289 Other specified places as the place of occurrence of the external cause: Secondary | ICD-10-CM | POA: Insufficient documentation

## 2015-11-19 DIAGNOSIS — Z880 Allergy status to penicillin: Secondary | ICD-10-CM | POA: Insufficient documentation

## 2015-11-19 MED ORDER — TRAMADOL HCL 50 MG PO TABS: 50.0000 mg | ORAL_TABLET | Freq: Four times a day (QID) | ORAL | Status: DC | PRN

## 2015-11-19 MED ORDER — OXYCODONE-ACETAMINOPHEN 5-325 MG PO TABS
1.0000 | ORAL_TABLET | Freq: Once | ORAL | Status: AC
  Administered 2015-11-19: 1 via ORAL
  Filled 2015-11-19: qty 1

## 2015-11-19 NOTE — Discharge Instructions (Signed)
Elbow Contusion °An elbow contusion is a deep bruise of the elbow. Contusions are the result of an injury that caused bleeding under the skin. The contusion may turn blue, purple, or yellow. Minor injuries will give you a painless contusion, but more severe contusions may stay painful and swollen for a few weeks.  °CAUSES  °An elbow contusion comes from a direct force to that area, such as falling on the elbow. °SYMPTOMS  °· Swelling and redness of the elbow. °· Bruising of the elbow area. °· Tenderness or soreness of the elbow. °DIAGNOSIS  °You will have a physical exam and will be asked about your history. You may need an X-ray of your elbow to look for a broken bone (fracture).  °TREATMENT  °A sling or splint may be needed to support your injury. Resting, elevating, and applying cold compresses to the elbow area are often the best treatments for an elbow contusion. Over-the-counter medicines may also be recommended for pain control. °HOME CARE INSTRUCTIONS  °· Put ice on the injured area. °¨ Put ice in a plastic bag. °¨ Place a towel between your skin and the bag. °¨ Leave the ice on for 15-20 minutes, 03-04 times a day. °· Only take over-the-counter or prescription medicines for pain, discomfort, or fever as directed by your caregiver. °· Rest your injured elbow until the pain and swelling are better. °· Elevate your elbow to reduce swelling. °· Apply a compression wrap as directed by your caregiver. This can help reduce swelling and motion. You may remove the wrap for sleeping, showers, and baths. If your fingers become numb, cold, or blue, take the wrap off and reapply it more loosely. °· Use your elbow only as directed by your caregiver. You may be asked to do range of motion exercises. Do them as directed. °· See your caregiver as directed. It is very important to keep all follow-up appointments in order to avoid any long-term problems with your elbow, including chronic pain or inability to move your elbow  normally. °SEEK IMMEDIATE MEDICAL CARE IF:  °· You have increased redness, swelling, or pain in your elbow. °· Your swelling or pain is not relieved with medicines. °· You have swelling of the hand and fingers. °· You are unable to move your fingers or wrist. °· You begin to lose feeling in your hand or fingers. °· Your fingers or hand become cold or blue. °MAKE SURE YOU:  °· Understand these instructions. °· Will watch your condition. °· Will get help right away if you are not doing well or get worse. °  °This information is not intended to replace advice given to you by your health care provider. Make sure you discuss any questions you have with your health care provider. °  °Document Released: 09/28/2006 Document Revised: 01/12/2012 Document Reviewed: 06/04/2015 °Elsevier Interactive Patient Education ©2016 Elsevier Inc. ° °

## 2015-11-19 NOTE — ED Provider Notes (Signed)
CSN: [id number]     Arrival date & time 11/19/15  0746 History   By signing my name below, I, Susan Wheeler, attest that this documentation has been prepared under the direction and in the presence of Susan Razor, MD. Electronically Signed: Marica Wheeler, ED Scribe. 11/19/2015. 8:13 AM.   Chief Complaint  Patient presents with  . Elbow Pain   The history is provided by the patient. No language interpreter was used.   PCP: PROVIDER NOT IN SYSTEM HPI Comments: Susan Wheeler is a 40 y.o. female who presents to the Emergency Department complaining of acute,  traumatic, intermittent bilateral elbow pain onset one week ago after pt fell on both of her elbows on a concrete floor. Pt reports taking ibuprofen at home with minimal relief.   Past Medical History  Diagnosis Date  . Anxiety   . GERD (gastroesophageal reflux disease)   . Dyspareunia 04/25/2015  . Vaginal itching 04/25/2015  . Vaginal discharge 04/25/2015  . BV (bacterial vaginosis) 04/25/2015   Past Surgical History  Procedure Laterality Date  . Tubal ligation    . Mandible surgery    . Knee arthroscopy Left   . Cholecystectomy N/A 11/06/2014    Procedure: LAPAROSCOPIC CHOLECYSTECTOMY;  Surgeon: Dalia Heading, MD;  Location: [facility];  Service: General;  Laterality: N/A;  . Vaginal hysterectomy N/A 08/15/2015    Procedure: HYSTERECTOMY VAGINAL;  Surgeon: Lazaro Arms, MD;  Location: [facility];  Service: Gynecology;  Laterality: N/A;   Family History  Problem Relation Age of Onset  . Migraines Mother   . Asthma Daughter   . Asthma Son   . Cancer Maternal Grandmother   . COPD Maternal Grandfather   . Asthma Son    Social History  Substance Use Topics  . Smoking status: Never Smoker   . Smokeless tobacco: Never Used  . Alcohol Use: No   OB History    Gravida Para Term Preterm AB TAB SAB Ectopic Multiple Living   3 3        3      Review of Systems  Constitutional: Negative for fever.  Musculoskeletal: Positive for  arthralgias (bilateral elbow pain).  All other systems reviewed and are negative.  Allergies  Codeine; Dilaudid; and Penicillins  Home Medications   Prior to Admission medications   Medication Sig Start Date End Date Taking? Authorizing Provider  Docusate Calcium (STOOL SOFTENER PO) Take by mouth.    Historical Provider, MD  zolpidem (AMBIEN) 10 MG tablet Take 1 tablet (10 mg total) by mouth at bedtime as needed for sleep. 08/22/15 09/21/15  Lazaro Arms, MD   Triage Vitals: BP 128/85 mmHg  Pulse 88  Temp(Src) 98.1 F (36.7 C) (Oral)  Resp 16  Ht 5\' 4"  (1.626 m)  Wt 175 lb (79.379 kg)  BMI 30.02 kg/m2  SpO2 100%  LMP 08/10/2015 Physical Exam  Constitutional: She is oriented to person, place, and time. She appears well-developed and well-nourished.  HENT:  Head: Normocephalic and atraumatic.  Eyes: Conjunctivae and EOM are normal. Pupils are equal, round, and reactive to light.  Neck: Normal range of motion. Neck supple.  Cardiovascular: Normal rate and regular rhythm.   Pulmonary/Chest: Effort normal and breath sounds normal.  Abdominal: Soft. Bowel sounds are normal.  Musculoskeletal: Normal range of motion.  Difficulty with full extending of left elbow secondary to pain. Point tenderness over left olecranon. NVI distally. Right elbow with minimal olecranon tenderness, able to activate FROM.   Neurological: She  is alert and oriented to person, place, and time.  Skin: Skin is warm and dry.  Psychiatric: She has a normal mood and affect. Her behavior is normal.  Nursing note and vitals reviewed.   ED Course  Procedures (including critical care time) DIAGNOSTIC STUDIES: Oxygen Saturation is 100% on ra, nl by my interpretation.    COORDINATION OF CARE: 8:10 AM: Discussed treatment plan which includes imaging with pt at bedside; patient verbalizes understanding and agrees with treatment plan. Pt also offered meds for pain management which pt declined at this time.  9:03  AM: Recheck, pt resting comfortably. Discussed imaging results and meds for pain management with pt.  Imaging Review Dg Elbow Complete Left  11/19/2015  CLINICAL DATA:  Pain.  Injury. EXAM: LEFT ELBOW - COMPLETE 3+ VIEW COMPARISON:  None. FINDINGS: No acute bony or joint abnormality identified. No evidence of fracture or dislocation. IMPRESSION: Negative. Electronically Signed   By: Maisie Fushomas  Register   On: 11/19/2015 08:52   I have personally reviewed and evaluated these images as part of my medical decision-making.   MDM   Final diagnoses:  Elbow contusion, left, initial encounter    Likely contusion. Negative imaging. Closed injury. Neurovascularly intact. Plan symptomatic treatment.  I personally preformed the services scribed in my presence. The recorded information has been reviewed is accurate. Susan RazorStephen Kohut, MD.    Susan RazorStephen Kohut, MD 11/28/15 2010

## 2015-11-19 NOTE — ED Notes (Signed)
Pt fell on both of her elbows last weekend in the snow. Pt is still having pain in both elbows, primarily the left.

## 2017-10-04 ENCOUNTER — Emergency Department (HOSPITAL_COMMUNITY): Payer: Self-pay

## 2017-10-04 ENCOUNTER — Emergency Department (HOSPITAL_COMMUNITY)
Admission: EM | Admit: 2017-10-04 | Discharge: 2017-10-04 | Disposition: A | Discharge status: Home / Self Care | Payer: Self-pay | Attending: Emergency Medicine | Admitting: Emergency Medicine

## 2017-10-04 ENCOUNTER — Encounter (HOSPITAL_COMMUNITY): Payer: Self-pay | Admitting: Emergency Medicine

## 2017-10-04 ENCOUNTER — Other Ambulatory Visit: Payer: Self-pay

## 2017-10-04 DIAGNOSIS — M7711 Lateral epicondylitis, right elbow: Secondary | ICD-10-CM | POA: Insufficient documentation

## 2017-10-04 NOTE — Discharge Instructions (Signed)
Please try to avoid activities that make your symptoms worsen over the next 1-2 days.  Please wear the brace for the next few days to try and improve your symptoms.  Take 600 mg of ibuprofen with food or 650 mg of Tylenol every 6 hours to help with pain. Apply ice for 15-20 minutes up to 3-4 times a day.  I have also attached some exercises that you may start to perform as your pain allows.  If your symptoms do not start to improve in the next week, please call Dr. Luiz BlareGraves office to schedule a follow-up appointment.  You can also call the number on your discharge paperwork to get established with a primary care provider.  If you develop new or worsening symptoms, including fever, chills, or if the elbow becomes red, hot, or swollen, please return to the emergency department for reevaluation.

## 2017-10-04 NOTE — ED Provider Notes (Signed)
[facility] EMERGENCY DEPARTMENT Provider Note   CSN: [id number] Arrival date & time: 10/04/17  1601     History   Chief Complaint Chief Complaint  Patient presents with  . Elbow Pain    HPI Susan Wheeler is a 41 y.o. female presents to the emergency department with a chief complaint of right elbow pain that began approximately 1 week ago, but worsened last night and the area was swollen, but the swelling has since resolved.  No known trauma or injury, but the patient states that she works in a [employer], where she is lifting boxes over her head onto shelves quite frequently.  She denies right wrist or shoulder pain.  No numbness or weakness.  No neck or back pain or left arm pain.  No previous surgery or injury.  She has treated her pain with 600 mg of ibuprofen every 6-8 hours over the last day.  She is established with Dr. Luiz BlareGraves in [place].  The history is provided by the patient.    Past Medical History:  Diagnosis Date  . Anxiety   . BV (bacterial vaginosis) 04/25/2015  . Dyspareunia 04/25/2015  . GERD (gastroesophageal reflux disease)   . Vaginal discharge 04/25/2015  . Vaginal itching 04/25/2015    Patient Active Problem List   Diagnosis Date Noted  . S/P vaginal hysterectomy 08/15/2015  . Dyspareunia 04/25/2015  . Vaginal itching 04/25/2015  . Vaginal discharge 04/25/2015  . BV (bacterial vaginosis) 04/25/2015  . NAUSEA 01/22/2009  . ABDOMINAL PAIN 01/22/2009  . RUQ PAIN 01/22/2009    Past Surgical History:  Procedure Laterality Date  . CHOLECYSTECTOMY N/A 11/06/2014   Procedure: LAPAROSCOPIC CHOLECYSTECTOMY;  Surgeon: Dalia HeadingMark A Jenkins, MD;  Location: [facility];  Service: General;  Laterality: N/A;  . KNEE ARTHROSCOPY Left   . MANDIBLE SURGERY    . TUBAL LIGATION    . VAGINAL HYSTERECTOMY N/A 08/15/2015   Procedure: HYSTERECTOMY VAGINAL;  Surgeon: Lazaro ArmsLuther H Eure, MD;  Location: [facility];  Service: Gynecology;  Laterality: N/A;     OB History    Gravida Para Term Preterm AB Living   3 3       3    SAB TAB Ectopic Multiple Live Births                   Home Medications    Prior to Admission medications   Medication Sig Start Date End Date Taking? Authorizing Provider  ibuprofen (ADVIL,MOTRIN) 200 MG tablet Take 400 mg by mouth every 6 (six) hours as needed for moderate pain.    [provider]  traMADol (ULTRAM) 50 MG tablet Take 1 tablet (50 mg total) by mouth every 6 (six) hours as needed. 11/19/15   Raeford RazorKohut, Stephen, MD  zolpidem (AMBIEN) 10 MG tablet Take 1 tablet (10 mg total) by mouth at bedtime as needed for sleep. 08/22/15 09/21/15  Lazaro ArmsEure, Luther H, MD    Family History Family History  Problem Relation Age of Onset  . Migraines Mother   . Asthma Daughter   . Asthma Son   . Cancer Maternal Grandmother   . COPD Maternal Grandfather   . Asthma Son     Social History Social History   Tobacco Use  . Smoking status: Never Smoker  . Smokeless tobacco: Never Used  Substance Use Topics  . Alcohol use: No  . Drug use: No     Allergies   Codeine; Dilaudid [hydromorphone]; and Penicillins   Review  of Systems Review of Systems  Musculoskeletal: Positive for arthralgias, joint swelling and myalgias. Negative for back pain, gait problem and neck pain.  Neurological: Negative for weakness and numbness.     Physical Exam Updated Vital Signs BP 114/88 (BP Location: Left Arm)   Pulse 98   Temp 98.1 F (36.7 C) (Oral)   Resp 18   Ht 5\' 3"  (1.6 m)   Wt 85.3 kg (188 lb)   LMP 08/10/2015   SpO2 100%   BMI 33.30 kg/m   Physical Exam  Constitutional: No distress.  HENT:  Head: Normocephalic.  Eyes: Conjunctivae are normal.  Neck: Neck supple.  Cardiovascular: Normal rate and regular rhythm. Exam reveals no gallop and no friction rub.  No murmur heard. Pulmonary/Chest: Effort normal. No respiratory distress.  Abdominal: Soft. She exhibits no distension.  Musculoskeletal:   Tender to palpation to the lateral epicondyles of the right elbow and to the distal humerus located near the lateral epicondyle.  No medial epicondyle tenderness.  Full active and passive range of motion of the right shoulder, elbow, and wrist.  Radial pulses are 2+ and symmetric.  Sensation is intact throughout.  No overlying erythema, edema, or warmth.  Neurological: She is alert.  Skin: Skin is warm. No rash noted.  Psychiatric: Her behavior is normal.  Nursing note and vitals reviewed.    ED Treatments / Results  Labs (all labs ordered are listed, but only abnormal results are displayed) Labs Reviewed - No data to display  EKG  EKG Interpretation None       Radiology Dg Elbow Complete Right  Result Date: 10/04/2017 CLINICAL DATA:  Right elbow pain EXAM: RIGHT ELBOW - COMPLETE 3+ VIEW COMPARISON:  None. FINDINGS: No fracture. No bone lesion. Elbow joint is normally spaced and aligned. No significant arthropathic change. No joint effusion. Soft tissues are unremarkable. IMPRESSION: Negative. Electronically Signed   By: Amie Portlandavid  Ormond M.D.   On: 10/04/2017 16:34    Procedures Procedures (including critical care time)  Medications Ordered in ED Medications - No data to display   Initial Impression / Assessment and Plan / ED Course  I have reviewed the triage vital signs and the nursing notes.  Pertinent labs & imaging results that were available during my care of the patient were reviewed by me and considered in my medical decision making (see chart for details).     Patient presenting with symptoms consistent with lateral epicondylitis.  Doubt septic arthritis or gout. Patient X-Ray negative for obvious fracture or dislocation.  She declines pain control in the emergency department.  Pt advised to follow up with orthopedics if symptoms do not improve with conservative management patient given an ace wrap while in ED, RICE therapy recommended and discussed. Patient will be dc  home & is agreeable with above plan.   Final Clinical Impressions(s) / ED Diagnoses   Final diagnoses:  Lateral epicondylitis of right elbow    ED Discharge Orders    None        Barkley BoardsMcDonald, Mia A, PA-C 10/04/17 1726    Mesner, Barbara CowerJason, MD 10/04/17 2328

## 2017-10-04 NOTE — ED Triage Notes (Signed)
Patient c/o right elbow pain that started last night. Per patient no known injury but repeatedly lift heavy boxes at work. Per patient swelling to elbow. Ibuprofen taken for pain with no relief, last dose "a few hours ago."

## 2017-10-04 NOTE — ED Notes (Signed)
Awaiting dispo

## 2017-10-04 NOTE — ED Notes (Signed)
To Rad 

## 2018-04-11 ENCOUNTER — Other Ambulatory Visit: Payer: Self-pay

## 2018-04-11 ENCOUNTER — Emergency Department (HOSPITAL_COMMUNITY)
Admission: EM | Admit: 2018-04-11 | Discharge: 2018-04-12 | Disposition: A | Discharge status: Home / Self Care | Payer: Self-pay | Attending: Emergency Medicine | Admitting: Emergency Medicine

## 2018-04-11 ENCOUNTER — Encounter (HOSPITAL_COMMUNITY): Payer: Self-pay | Admitting: Emergency Medicine

## 2018-04-11 DIAGNOSIS — Z79899 Other long term (current) drug therapy: Secondary | ICD-10-CM | POA: Insufficient documentation

## 2018-04-11 DIAGNOSIS — N3090 Cystitis, unspecified without hematuria: Secondary | ICD-10-CM | POA: Insufficient documentation

## 2018-04-11 LAB — URINALYSIS, ROUTINE W REFLEX MICROSCOPIC
Bacteria, UA: NONE SEEN
Bilirubin Urine: NEGATIVE
Glucose, UA: NEGATIVE mg/dL
Ketones, ur: NEGATIVE mg/dL
Nitrite: NEGATIVE
Protein, ur: NEGATIVE mg/dL
Specific Gravity, Urine: 1.008 (ref 1.005–1.030)
pH: 8 (ref 5.0–8.0)

## 2018-04-11 LAB — WET PREP, GENITAL
Clue Cells Wet Prep HPF POC: NONE SEEN
Trich, Wet Prep: NONE SEEN
Yeast Wet Prep HPF POC: NONE SEEN

## 2018-04-11 NOTE — ED Triage Notes (Signed)
Pt c/o lower back/abd pain for a few days with frequent urination. Pt also wants to be checked for stds.

## 2018-04-11 NOTE — ED Notes (Signed)
Pelvic at bedside   

## 2018-04-11 NOTE — ED Provider Notes (Signed)
[facility] EMERGENCY DEPARTMENT Provider Note   CSN: [id number] Arrival date & time: 04/11/18  2223     History   Chief Complaint Chief Complaint  Patient presents with  . Abdominal Pain    HPI Susan Wheeler is a 42 y.o. female.  HPI   Susan Wheeler is a 42 y.o. female who presents to the Emergency Department complaining of lower abdominal and back pain associated with increased frequency of urination.  Symptoms present for 3 days.  Also reports, recent unprotected intercourse and requests to be evaluated for STD's.  States she is urinating normally and does not have burning, odor, or hesitancy.  No vaginal discharge, abnormal vaginal bleeding, fever, vomiting, or genital lesions.     Past Medical History:  Diagnosis Date  . Anxiety   . BV (bacterial vaginosis) 04/25/2015  . Dyspareunia 04/25/2015  . GERD (gastroesophageal reflux disease)   . Vaginal discharge 04/25/2015  . Vaginal itching 04/25/2015    Patient Active Problem List   Diagnosis Date Noted  . S/P vaginal hysterectomy 08/15/2015  . Dyspareunia 04/25/2015  . Vaginal itching 04/25/2015  . Vaginal discharge 04/25/2015  . BV (bacterial vaginosis) 04/25/2015  . NAUSEA 01/22/2009  . ABDOMINAL PAIN 01/22/2009  . RUQ PAIN 01/22/2009    Past Surgical History:  Procedure Laterality Date  . CHOLECYSTECTOMY N/A 11/06/2014   Procedure: LAPAROSCOPIC CHOLECYSTECTOMY;  Surgeon: Dalia Heading, MD;  Location: [facility];  Service: General;  Laterality: N/A;  . KNEE ARTHROSCOPY Left   . MANDIBLE SURGERY    . TUBAL LIGATION    . VAGINAL HYSTERECTOMY N/A 08/15/2015   Procedure: HYSTERECTOMY VAGINAL;  Surgeon: Lazaro Arms, MD;  Location: [facility];  Service: Gynecology;  Laterality: N/A;     OB History    Gravida  3   Para  3   Term      Preterm      AB      Living  3     SAB      TAB      Ectopic      Multiple      Live Births               Home Medications    Prior to Admission medications     Medication Sig Start Date End Date Taking? Authorizing Provider  ibuprofen (ADVIL,MOTRIN) 200 MG tablet Take 400 mg by mouth every 6 (six) hours as needed for moderate pain.    [provider]  traMADol (ULTRAM) 50 MG tablet Take 1 tablet (50 mg total) by mouth every 6 (six) hours as needed. 11/19/15   Raeford Razor, MD  zolpidem (AMBIEN) 10 MG tablet Take 1 tablet (10 mg total) by mouth at bedtime as needed for sleep. 08/22/15 09/21/15  Lazaro Arms, MD    Family History Family History  Problem Relation Age of Onset  . Migraines Mother   . Asthma Daughter   . Asthma Son   . Cancer Maternal Grandmother   . COPD Maternal Grandfather   . Asthma Son     Social History Social History   Tobacco Use  . Smoking status: Never Smoker  . Smokeless tobacco: Never Used  Substance Use Topics  . Alcohol use: No  . Drug use: No     Allergies   Codeine; Dilaudid [hydromorphone]; and Penicillins   Review of Systems Review of Systems  Constitutional: Negative for appetite change, chills and fever.  Respiratory: Negative for shortness of  breath.   Cardiovascular: Negative for chest pain.  Gastrointestinal: Positive for abdominal pain (lower abdominal pain). Negative for blood in stool, nausea and vomiting.  Genitourinary: Positive for frequency. Negative for decreased urine volume, difficulty urinating, dysuria, flank pain, genital sores, menstrual problem, vaginal bleeding, vaginal discharge and vaginal pain.  Musculoskeletal: Negative for back pain.  Skin: Negative for color change and rash.  Neurological: Negative for dizziness, weakness and numbness.  Hematological: Negative for adenopathy.  All other systems reviewed and are negative.    Physical Exam Updated Vital Signs BP 140/88 (BP Location: Right Arm)   Pulse 80   Temp 98.6 F (37 C) (Oral)   Resp 18   Ht 5\' 4"  (1.626 m)   Wt 86.2 kg (190 lb)   LMP 08/10/2015   SpO2 100%   BMI 32.61 kg/m   Physical  Exam  Constitutional: She appears well-developed and well-nourished. She does not appear ill.  HENT:  Mouth/Throat: Oropharynx is clear and moist.  Cardiovascular: Normal rate and regular rhythm.  Pulmonary/Chest: Effort normal and breath sounds normal.  Abdominal: Soft. Normal appearance. She exhibits no distension and no mass. There is tenderness in the suprapubic area. There is no rigidity, no rebound, no guarding, no CVA tenderness and no tenderness at McBurney's point.  No CVA tenderness  Genitourinary: Uterus normal. There is no rash on the right labia. There is no rash on the left labia. Cervix exhibits no motion tenderness and no discharge. Right adnexum displays no mass and no tenderness. Left adnexum displays no mass and no tenderness. No bleeding in the vagina. No foreign body in the vagina. No vaginal discharge found.  Genitourinary Comments: Pelvic exam assisted by nursing.    Musculoskeletal: Normal range of motion.  Neurological: She is alert.  Skin: Skin is warm.  Psychiatric: She has a normal mood and affect.  Nursing note and vitals reviewed.    ED Treatments / Results  Labs (all labs ordered are listed, but only abnormal results are displayed) Labs Reviewed  WET PREP, GENITAL - Abnormal; Notable for the following components:      Result Value   WBC, Wet Prep HPF POC FEW (*)    All other components within normal limits  URINALYSIS, ROUTINE W REFLEX MICROSCOPIC - Abnormal; Notable for the following components:   APPearance HAZY (*)    Hgb urine dipstick SMALL (*)    Leukocytes, UA TRACE (*)    All other components within normal limits  URINE CULTURE  RPR  HIV ANTIBODY (ROUTINE TESTING)  GC/CHLAMYDIA PROBE AMP ([place] NOT AT [facility]    EKG None  Radiology No results found.  Procedures Procedures (including critical care time)  Medications Ordered in ED Medications - No data to display   Initial Impression / Assessment and Plan / ED Course  I have  reviewed the triage vital signs and the nursing notes.  Pertinent labs & imaging results that were available during my care of the patient were reviewed by me and considered in my medical decision making (see chart for details).     Pt well appearing.  vitals reviewed.  No concerning sx's for acute abdomen.  No significant vaginal discharge.  Offered prophylactic STD treatment, but pt prefers to wait for culture results.  Likely UTI, no concerning sx's for pyelonephritis.  Will tx with keflex and discussed return precautions.   Final Clinical Impressions(s) / ED Diagnoses   Final diagnoses:  Cystitis    ED Discharge Orders  None       Pauline Aus, PA-C 04/13/18 1432    Bethann Berkshire, MD 04/13/18 2153

## 2018-04-12 MED ORDER — CEPHALEXIN 500 MG PO CAPS
500.0000 mg | ORAL_CAPSULE | Freq: Once | ORAL | Status: AC
  Administered 2018-04-12: 500 mg via ORAL
  Filled 2018-04-12: qty 1

## 2018-04-12 MED ORDER — CEPHALEXIN 500 MG PO CAPS: 500.0000 mg | ORAL_CAPSULE | Freq: Four times a day (QID) | ORAL | 0 refills | Status: DC

## 2018-04-12 NOTE — Discharge Instructions (Addendum)
Drink plenty of water and cranberry juice.  Take the antibiotic as directed until its finished.  Follow-up with your primary provider for recheck if needed.  You will be notified if any of your cultures are positive.

## 2018-04-13 LAB — RPR: RPR Ser Ql: NONREACTIVE

## 2018-04-13 LAB — URINE CULTURE: Culture: <10000 — AB

## 2018-04-13 LAB — HIV ANTIBODY (ROUTINE TESTING W REFLEX): HIV Screen 4th Generation wRfx: NONREACTIVE

## 2018-04-13 LAB — GC/CHLAMYDIA PROBE AMP (~~LOC~~) NOT AT ARMC
Chlamydia: NEGATIVE
Neisseria Gonorrhea: NEGATIVE

## 2021-02-05 ENCOUNTER — Ambulatory Visit: Payer: Self-pay | Admitting: Family Medicine

## 2021-02-05 VITALS — BP 124/80 | HR 74 | Ht 63.0 in | Wt 196.0 lb

## 2021-02-05 DIAGNOSIS — Z789 Other specified health status: Secondary | ICD-10-CM

## 2021-02-05 DIAGNOSIS — J302 Other seasonal allergic rhinitis: Secondary | ICD-10-CM

## 2021-02-05 NOTE — Progress Notes (Signed)
Subjective:     Patient ID: Susan Wheeler, female   DOB: 1976/03/28, 45 y.o.   MRN: [id number]  HPI  Carolanne presents to the employee health and wellness clinic today for her wellness visit required by her insurance. She does not currently have a PCP. She has never had a mammogram. She has had a hysterectomy. She received flu vaccine last year and is UTD on covid-19 vaccine and booster. She has not had any screening lab work in several years. She desires weight loss and states she does not eat very healthy, eats out a lot, and does not get any physical activity in outside of walking at work. She reports having some PND causing a cough, states she has a hx of seasonal allergies, has taken xyzal with no relief. Denies any other problems or concerns.   Past Medical History:  Diagnosis Date  . Anxiety   . BV (bacterial vaginosis) 04/25/2015  . Dyspareunia 04/25/2015  . GERD (gastroesophageal reflux disease)   . Vaginal discharge 04/25/2015  . Vaginal itching 04/25/2015   Allergies  Allergen Reactions  . Codeine Hives  . Dilaudid [Hydromorphone] Nausea And Vomiting  . Penicillins Hives and Nausea And Vomiting    Has patient had a PCN reaction causing immediate rash, facial/tongue/throat swelling, SOB or lightheadedness with hypotension: No Has patient had a PCN reaction causing severe rash involving mucus membranes or skin necrosis: No Has patient had a PCN reaction that required hospitalization No Has patient had a PCN reaction occurring within the last 10 years: No If all of the above answers are "NO", then may proceed with Cephalosporin use.    No current outpatient medications on file.      Review of Systems  Constitutional: Negative for chills, fatigue, fever and unexpected weight change.  HENT: Positive for postnasal drip. Negative for congestion, ear pain, sinus pressure, sinus pain and sore throat.   Eyes: Negative for discharge and visual disturbance.  Respiratory: Positive for  cough. Negative for shortness of breath and wheezing.   Cardiovascular: Negative for chest pain and leg swelling.  Gastrointestinal: Negative for abdominal pain, blood in stool, constipation, diarrhea, nausea and vomiting.  Genitourinary: Negative for difficulty urinating and hematuria.  Skin: Negative for color change.  Neurological: Negative for dizziness, weakness, light-headedness and headaches.  Hematological: Negative for adenopathy.  All other systems reviewed and are negative.      Objective:   Physical Exam Vitals reviewed.  Constitutional:      General: She is not in acute distress.    Appearance: Normal appearance. She is well-developed.  HENT:     Head: Normocephalic and atraumatic.  Eyes:     General:        Right eye: No discharge.        Left eye: No discharge.  Cardiovascular:     Rate and Rhythm: Normal rate and regular rhythm.     Heart sounds: Normal heart sounds.  Pulmonary:     Effort: Pulmonary effort is normal. No respiratory distress.     Breath sounds: Normal breath sounds.  Musculoskeletal:     Cervical back: Neck supple.  Skin:    General: Skin is warm and dry.  Neurological:     Mental Status: She is alert and oriented to person, place, and time.  Psychiatric:        Mood and Affect: Mood normal.        Behavior: Behavior normal.      Today's Vitals   02/05/21  1544  BP: 124/80  Pulse: 74  SpO2: 98%  Weight: 196 lb (88.9 kg)  Height: 5\' 3"  (1.6 m)   Body mass index is 34.72 kg/m.     Assessment:     Participant in health and wellness plan  Seasonal allergies      Plan:     1. Encouraged getting established with PCP. She will come in next week for fasting lab work. Needs a mammogram, mobile mammogram unit is coming to [facility] next week, encouraged her to call for an appointment time. Recommend she see PCP for possible referral to weight management specialist or RD for help with nutrition coaching and accountability.  Encouraged efforts at increasing physical activity. Lengthy discussion on ways to incorporate healthy eating, decreasing and eliminating soda, accountability.  2. Recommend trying a steroid nasal spray like flonase and see if that helps alleviate her symptoms. May also try a different oral non-drowsy antihistamine, like loratadine or fexofenadine. F/u here prn.

## 2021-02-12 ENCOUNTER — Other Ambulatory Visit: Payer: Self-pay | Admitting: Family Medicine

## 2021-02-12 LAB — CBC: WBC: 6 10*3/uL (ref 3.8–10.8)

## 2021-02-13 LAB — LIPID PANEL
Cholesterol: 139 mg/dL (ref <200)
HDL: 45 mg/dL — ABNORMAL LOW (ref >=50)
LDL Cholesterol (Calc): 76 mg/dL (calc)
Non-HDL Cholesterol (Calc): 94 mg/dL (calc) (ref <130)
Total CHOL/HDL Ratio: 3.1 (calc) (ref <5.0)
Triglycerides: 98 mg/dL (ref <150)

## 2021-02-13 LAB — COMPLETE METABOLIC PANEL WITH GFR
AG Ratio: 1.6 (calc) (ref 1.0–2.5)
ALT: 18 U/L (ref 6–29)
AST: 20 U/L (ref 10–30)
Albumin: 4.2 g/dL (ref 3.6–5.1)
Alkaline phosphatase (APISO): 57 U/L (ref 31–125)
BUN: 15 mg/dL (ref 7–25)
CO2: 21 mmol/L (ref 20–32)
Calcium: 9.1 mg/dL (ref 8.6–10.2)
Chloride: 106 mmol/L (ref 98–110)
Creat: 0.79 mg/dL (ref 0.50–1.10)
GFR, Est African American: 107 mL/min/{1.73_m2} (ref >=60)
GFR, Est Non African American: 92 mL/min/{1.73_m2} (ref >=60)
Globulin: 2.7 g/dL (calc) (ref 1.9–3.7)
Glucose, Bld: 97 mg/dL (ref 65–99)
Potassium: 4.1 mmol/L (ref 3.5–5.3)
Sodium: 137 mmol/L (ref 135–146)
Total Bilirubin: 0.7 mg/dL (ref 0.2–1.2)
Total Protein: 6.9 g/dL (ref 6.1–8.1)

## 2021-02-13 LAB — CBC
HCT: 42 % (ref 35.0–45.0)
Hemoglobin: 13.6 g/dL (ref 11.7–15.5)
MCH: 27.9 pg (ref 27.0–33.0)
MCHC: 32.4 g/dL (ref 32.0–36.0)
MCV: 86.1 fL (ref 80.0–100.0)
MPV: 12.2 fL (ref 7.5–12.5)
Platelets: 274 10*3/uL (ref 140–400)
RBC: 4.88 10*6/uL (ref 3.80–5.10)
RDW: 12.4 % (ref 11.0–15.0)

## 2021-02-13 LAB — HEMOGLOBIN A1C W/OUT EAG: Hgb A1c MFr Bld: 4.9 % of total Hgb (ref <5.7)

## 2021-03-28 ENCOUNTER — Ambulatory Visit: Payer: Self-pay | Admitting: Family

## 2021-03-28 ENCOUNTER — Encounter: Payer: Self-pay | Admitting: Family

## 2021-03-28 DIAGNOSIS — R059 Cough, unspecified: Secondary | ICD-10-CM | POA: Insufficient documentation

## 2021-03-28 DIAGNOSIS — J029 Acute pharyngitis, unspecified: Secondary | ICD-10-CM | POA: Insufficient documentation

## 2021-03-28 NOTE — Progress Notes (Signed)
Acute Office Visit  Subjective:    Patient ID: Susan Wheeler, female    DOB: 1976/05/20, 45 y.o.   MRN: [id number]  Cough and Sore throat since Tuesday.   HPI Patient is in today for concerns of possible Covid-like symptoms.  Her husband was sick with Covid about 2 weeks ago.  She started to have a sore throat on Saturday, and took and at home Covid-19 test that was positive.  She had a Covid PCR obtained at [facility] on Tuesday.  She is still awaiting those results.  Today she was given a rapid antigen test at work- was negative.  She continues to have symptoms of sore throat, cough and congestion.    Past Medical History:  Diagnosis Date  . Anxiety   . BV (bacterial vaginosis) 04/25/2015  . Dyspareunia 04/25/2015  . GERD (gastroesophageal reflux disease)   . Vaginal discharge 04/25/2015  . Vaginal itching 04/25/2015    Past Surgical History:  Procedure Laterality Date  . CHOLECYSTECTOMY N/A 11/06/2014   Procedure: LAPAROSCOPIC CHOLECYSTECTOMY;  Surgeon: Dalia Heading, MD;  Location: [facility];  Service: General;  Laterality: N/A;  . KNEE ARTHROSCOPY Left   . MANDIBLE SURGERY    . TUBAL LIGATION    . VAGINAL HYSTERECTOMY N/A 08/15/2015   Procedure: HYSTERECTOMY VAGINAL;  Surgeon: Lazaro Arms, MD;  Location: [facility];  Service: Gynecology;  Laterality: N/A;    Family History  Problem Relation Age of Onset  . Migraines Mother   . Asthma Daughter   . Asthma Son   . Cancer Maternal Grandmother   . COPD Maternal Grandfather   . Asthma Son     Social History   Socioeconomic History  . Marital status: Married    Spouse name: Not on file  . Number of children: Not on file  . Years of education: Not on file  . Highest education level: Not on file  Occupational History  . Not on file  Tobacco Use  . Smoking status: Never Smoker  . Smokeless tobacco: Never Used  Vaping Use  . Vaping Use: Never used  Substance and Sexual Activity  . Alcohol use: No  . Drug use: No  . Sexual  activity: Not Currently    Birth control/protection: Surgical    Comment: tubal  Other Topics Concern  . Not on file  Social History Narrative  . Not on file   Social Determinants of Health   Financial Resource Strain: Not on file  Food Insecurity: Not on file  Transportation Needs: Not on file  Physical Activity: Not on file  Stress: Not on file  Social Connections: Not on file  Intimate Partner Violence: Not on file    No outpatient medications prior to visit.   No facility-administered medications prior to visit.    Allergies  Allergen Reactions  . Codeine Hives  . Dilaudid [Hydromorphone] Nausea And Vomiting  . Penicillins Hives and Nausea And Vomiting    Has patient had a PCN reaction causing immediate rash, facial/tongue/throat swelling, SOB or lightheadedness with hypotension: No Has patient had a PCN reaction causing severe rash involving mucus membranes or skin necrosis: No Has patient had a PCN reaction that required hospitalization No Has patient had a PCN reaction occurring within the last 10 years: No If all of the above answers are "NO", then may proceed with Cephalosporin use.     Review of Systems  Constitutional: Negative.   HENT: Positive for congestion, rhinorrhea, sinus pressure and sore  throat.   Eyes: Negative.   Respiratory: Negative.   Cardiovascular: Negative.   Gastrointestinal: Negative.   Endocrine: Negative.   Genitourinary: Negative.   Musculoskeletal: Negative.   Skin: Negative.   Allergic/Immunologic: Positive for environmental allergies.  Neurological: Positive for headaches.  Hematological: Negative.   Psychiatric/Behavioral: Negative.        Objective:    Physical Exam Constitutional:      Appearance: Normal appearance.  HENT:     Head: Normocephalic and atraumatic.     Right Ear: Tympanic membrane normal.     Left Ear: Tympanic membrane normal.     Nose: Congestion and rhinorrhea present.     Right Turbinates:  Enlarged.     Left Turbinates: Enlarged.     Right Sinus: Frontal sinus tenderness present.     Left Sinus: Frontal sinus tenderness present.     Mouth/Throat:     Pharynx: Posterior oropharyngeal erythema present.  Eyes:     Pupils: Pupils are equal, round, and reactive to light.  Cardiovascular:     Rate and Rhythm: Normal rate and regular rhythm.  Pulmonary:     Effort: Pulmonary effort is normal.     Breath sounds: Normal breath sounds.  Neurological:     General: No focal deficit present.     Mental Status: She is alert.  Psychiatric:        Mood and Affect: Mood normal.        Behavior: Behavior normal.     BP 120/78 (BP Location: Right Arm)   Pulse 80   Resp 16   Ht 5\' 3"  (1.6 m)   Wt 195 lb (88.5 kg)   LMP 08/10/2015   SpO2 96%   BMI 34.54 kg/m  Wt Readings from Last 3 Encounters:  03/28/21 195 lb (88.5 kg)  02/05/21 196 lb (88.9 kg)  04/11/18 190 lb (86.2 kg)    Health Maintenance Due  Topic Date Due  . COVID-19 Vaccine (1) Never done  . Hepatitis C Screening  Never done  . TETANUS/TDAP  Never done  . PAP SMEAR-Modifier  06/25/2018    There are no preventive care reminders to display for this patient.   No results found for: TSH Lab Results  Component Value Date   WBC 6.0 02/12/2021   HGB 13.6 02/12/2021   HCT 42.0 02/12/2021   MCV 86.1 02/12/2021   PLT 274 02/12/2021   Lab Results  Component Value Date   NA 137 02/12/2021   K 4.1 02/12/2021   CO2 21 02/12/2021   GLUCOSE 97 02/12/2021   BUN 15 02/12/2021   CREATININE 0.79 02/12/2021   BILITOT 0.7 02/12/2021   ALKPHOS 47 08/10/2015   AST 20 02/12/2021   ALT 18 02/12/2021   PROT 6.9 02/12/2021   ALBUMIN 3.9 08/10/2015   CALCIUM 9.1 02/12/2021   ANIONGAP 4 (L) 08/16/2015   Lab Results  Component Value Date   CHOL 139 02/12/2021   Lab Results  Component Value Date   HDL 45 (L) 02/12/2021   Lab Results  Component Value Date   LDLCALC 76 02/12/2021   Lab Results  Component  Value Date   TRIG 98 02/12/2021   Lab Results  Component Value Date   CHOLHDL 3.1 02/12/2021   Lab Results  Component Value Date   HGBA1C 4.9 02/12/2021       Assessment & Plan:   Problem List Items Addressed This Visit      Other   Cough   Sore  throat      Liliane Channel, NP

## 2021-07-23 ENCOUNTER — Ambulatory Visit: Payer: Self-pay | Admitting: Family Medicine

## 2021-07-23 VITALS — BP 112/74 | HR 75

## 2021-07-23 DIAGNOSIS — G43009 Migraine without aura, not intractable, without status migrainosus: Secondary | ICD-10-CM

## 2021-07-23 DIAGNOSIS — R059 Cough, unspecified: Secondary | ICD-10-CM

## 2021-07-23 MED ORDER — ALBUTEROL SULFATE HFA 108 (90 BASE) MCG/ACT IN AERS: 2.0000 | INHALATION_SPRAY | Freq: Four times a day (QID) | RESPIRATORY_TRACT | 1 refills | Status: DC | PRN

## 2021-07-23 MED ORDER — SUMATRIPTAN-NAPROXEN SODIUM 85-500 MG PO TABS: ORAL_TABLET | ORAL | 0 refills | Status: DC

## 2021-07-23 NOTE — Progress Notes (Signed)
Subjective:     Patient ID: Susan Wheeler, female   DOB: [birth year], 45 y.o.   MRN: [id number]  HPI Susan Wheeler presents to the employee health and wellness clinic today for evaluation of migraine headaches and cough.   She reports having a migraine h/a every day or every other day over the last 1-2 weeks. She states they usually start in the front or back bilaterally and progress to all over throbbing pain. She reports noise and light sensitivity and nausea at times. She has had migraines in the past but have not been this regular. She cannot think of any aggravating factors. She has been taking otc tylenol or ibuprofen with some relief.   She also reports continued cough since covid-19 diagnosis back in May 2022. She states no improvement in cough. At times feels wheezing and shortness of breath. Has tried otc cough medicine, cough drops, allergy meds with no improvement.   She does not have a pcp  Past Medical History:  Diagnosis Date   Anxiety    BV (bacterial vaginosis) 04/25/2015   Dyspareunia 04/25/2015   GERD (gastroesophageal reflux disease)    Vaginal discharge 04/25/2015   Vaginal itching 04/25/2015   Allergies  Allergen Reactions   Codeine Hives   Dilaudid [Hydromorphone] Nausea And Vomiting   Penicillins Hives and Nausea And Vomiting    Has patient had a PCN reaction causing immediate rash, facial/tongue/throat swelling, SOB or lightheadedness with hypotension: No Has patient had a PCN reaction causing severe rash involving mucus membranes or skin necrosis: No Has patient had a PCN reaction that required hospitalization No Has patient had a PCN reaction occurring within the last 10 years: No If all of the above answers are "NO", then may proceed with Cephalosporin use.     Current Outpatient Medications:    acetaminophen (TYLENOL) 500 MG tablet, Take 1,000 mg by mouth every 6 (six) hours as needed., Disp: , Rfl:    albuterol (VENTOLIN HFA) 108 (90 Base) MCG/ACT inhaler,  Inhale 2 puffs into the lungs every 6 (six) hours as needed for wheezing or shortness of breath., Disp: 8 g, Rfl: 1   ibuprofen (ADVIL) 600 MG tablet, Take 600 mg by mouth every 6 (six) hours as needed., Disp: , Rfl:    SUMAtriptan-naproxen (TREXIMET) 85-500 MG tablet, Take 1 tablet at onset of migraine headache. May repeat dose as needed after 2 hours. Do not exceed two doses in 24 hours., Disp: 10 tablet, Rfl: 0     Review of Systems  Constitutional:  Negative for chills, fatigue and fever.  HENT:  Negative for congestion, ear pain, sinus pain and trouble swallowing.   Eyes:  Positive for photophobia. Negative for pain, discharge and visual disturbance.  Respiratory:  Positive for cough, shortness of breath and wheezing.   Cardiovascular:  Negative for chest pain and palpitations.  Gastrointestinal:  Positive for nausea. Negative for abdominal pain and vomiting.  Neurological:  Positive for headaches. Negative for dizziness, syncope, speech difficulty, weakness, light-headedness and numbness.      Objective:   Physical Exam Vitals reviewed.  Constitutional:      General: She is not in acute distress.    Appearance: Normal appearance. She is well-developed.  HENT:     Head: Normocephalic and atraumatic.  Eyes:     General:        Right eye: No discharge.        Left eye: No discharge.     Conjunctiva/sclera: Conjunctivae normal.  Pupils: Pupils are equal, round, and reactive to light.  Cardiovascular:     Rate and Rhythm: Normal rate and regular rhythm.     Heart sounds: Normal heart sounds.  Pulmonary:     Effort: Pulmonary effort is normal. No respiratory distress.     Breath sounds: Normal breath sounds. No wheezing, rhonchi or rales.  Musculoskeletal:     Cervical back: Neck supple. No rigidity.  Lymphadenopathy:     Cervical: No cervical adenopathy.  Skin:    General: Skin is warm and dry.  Neurological:     General: No focal deficit present.     Mental Status:  She is alert and oriented to person, place, and time. Mental status is at baseline.     Gait: Gait normal.  Psychiatric:        Mood and Affect: Mood normal.        Behavior: Behavior normal.   Today's Vitals   07/23/21 1540  BP: 112/74  Pulse: 75  SpO2: 98%   There is no height or weight on file to calculate BMI.     Assessment:     Migraine without aura and without status migrainosus, not intractable  Cough     Plan:     Trial sumatriptan-naproxen combo at first sign of migraine h/a. Do not use with otc ibuprofen. Max of two doses in 24 hours. Discussed medication overuse h/a. Recommend keeping a headache diary over the next 1-2 weeks to determine if there are any triggers. Warning signs and when to go to ED discussed with pt. Recommend establishing care with PCP for management of her migraines and possible referral to specialist if needed. F/u here prn.  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 Albuterol inhaler given for symptoms of wheezing or shortness of breath. Warning signs and when to go to ED discussed with pt. Again, recommend establishing care with PCP for further management and possible chest xray for further evaluation. Resources given to pt for finding a PCP. F/u here prn.

## 2021-07-23 NOTE — Patient Instructions (Addendum)
Please establish care with a primary care provider.   Migraine Headache A migraine headache is a very strong throbbing pain on one side or both sides of your head. This type of headache can also cause other symptoms. It can last from 4 hours to 3 days. Talk with your doctor about what things may bring on (trigger) this condition. What are the causes? The exact cause of this condition is not known. This condition may be triggered or caused by: Drinking alcohol. Smoking. Taking medicines, such as: Medicine used to treat chest pain (nitroglycerin). Birth control pills. Estrogen. Some blood pressure medicines. Eating or drinking certain products. Doing physical activity. Other things that may trigger a migraine headache include: Having a menstrual period. Pregnancy. Hunger. Stress. Not getting enough sleep or getting too much sleep. Weather changes. Tiredness (fatigue). What increases the risk? Being 47-22 years old. Being female. Having a family history of migraine headaches. Being Caucasian. Having depression or anxiety. Being very overweight. What are the signs or symptoms? A throbbing pain. This pain may: Happen in any area of the head, such as on one side or both sides. Make it hard to do daily activities. Get worse with physical activity. Get worse around bright lights or loud noises. Other symptoms may include: Feeling sick to your stomach (nauseous). Vomiting. Dizziness. Being sensitive to bright lights, loud noises, or smells. Before you get a migraine headache, you may get warning signs (an aura). An aura may include: Seeing flashing lights or having blind spots. Seeing bright spots, halos, or zigzag lines. Having tunnel vision or blurred vision. Having numbness or a tingling feeling. Having trouble talking. Having weak muscles. Some people have symptoms after a migraine headache (postdromal phase), such as: Tiredness. Trouble thinking (concentrating). How is  this treated? Taking medicines that: Relieve pain. Relieve the feeling of being sick to your stomach. Prevent migraine headaches. Treatment may also include: Having acupuncture. Avoiding foods that bring on migraine headaches. Learning ways to control your body functions (biofeedback). Therapy to help you know and deal with negative thoughts (cognitive behavioral therapy). Follow these instructions at home: Medicines Take over-the-counter and prescription medicines only as told by your doctor. Ask your doctor if the medicine prescribed to you: Requires you to avoid driving or using heavy machinery. Can cause trouble pooping (constipation). You may need to take these steps to prevent or treat trouble pooping: Drink enough fluid to keep your pee (urine) pale yellow. Take over-the-counter or prescription medicines. Eat foods that are high in fiber. These include beans, whole grains, and fresh fruits and vegetables. Limit foods that are high in fat and sugar. These include fried or sweet foods. Lifestyle Do not drink alcohol. Do not use any products that contain nicotine or tobacco, such as cigarettes, e-cigarettes, and chewing tobacco. If you need help quitting, ask your doctor. Get at least 8 hours of sleep every night. Limit and deal with stress. General instructions   Keep a journal to find out what may bring on your migraine headaches. For example, write down: What you eat and drink. How much sleep you get. Any change in what you eat or drink. Any change in your medicines. If you have a migraine headache: Avoid things that make your symptoms worse, such as bright lights. It may help to lie down in a dark, quiet room. Do not drive or use heavy machinery. Ask your doctor what activities are safe for you. Keep all follow-up visits as told by your doctor. This is important.  Contact a doctor if: You get a migraine headache that is different or worse than others you have had. You  have more than 15 headache days in one month. Get help right away if: Your migraine headache gets very bad. Your migraine headache lasts longer than 72 hours. You have a fever. You have a stiff neck. You have trouble seeing. Your muscles feel weak or like you cannot control them. You start to lose your balance a lot. You start to have trouble walking. You pass out (faint). You have a seizure. Summary A migraine headache is a very strong throbbing pain on one side or both sides of your head. These headaches can also cause other symptoms. This condition may be treated with medicines and changes to your lifestyle. Keep a journal to find out what may bring on your migraine headaches. Contact a doctor if you get a migraine headache that is different or worse than others you have had. Contact your doctor if you have more than 15 headache days in a month. This information is not intended to replace advice given to you by your health care provider. Make sure you discuss any questions you have with your health care provider. Document Revised: 02/11/2019 Document Reviewed: 12/02/2018 Elsevier Patient Education  2022 [employer]    Form - Headache Record There are many types and causes of headaches. A headache record can help guide your treatment plan. Use this form to record the details. Bring this form with you to your follow-up visits. Follow your health care provider's instructions on how to describe your headache. You may be asked to: Use a pain scale. This is a tool to rate the intensity of your headache using words or numbers. Describe what your headache feels like, such as dull, achy, throbbing, or sharp. Headache record Date: _______________ Time (from start to end): ____________________ Location of the headache: _________________________ Intensity of the headache: ____________________ Description of the headache: ______________________________________________________________ Hours  of sleep the night before the headache: __________ Food or drinks before the headache started: ______________________________________________________________________________________ Events before the headache started: _______________________________________________________________________________________________ Symptoms before the headache started: __________________________________________________________________________________________ Symptoms during the headache: __________________________________________________________________________________________________ Treatment: ________________________________________________________________________________________________________________ Effect of treatment: _________________________________________________________________________________________________________ Other comments: ___________________________________________________________________________________________________________ Date: _______________ Time (from start to end): ____________________ Location of the headache: _________________________ Intensity of the headache: ____________________ Description of the headache: ______________________________________________________________ Hours of sleep the night before the headache: __________ Food or drinks before the headache started: ______________________________________________________________________________________ Events before the headache started: ____________________________________________________________________________________________ Symptoms before the headache started: _________________________________________________________________________________________ Symptoms during the headache: _______________________________________________________________________________________________ Treatment: ________________________________________________________________________________________________________________ Effect of treatment:  _________________________________________________________________________________________________________ Other comments: ___________________________________________________________________________________________________________ Date: _______________ Time (from start to end): ____________________ Location of the headache: _________________________ Intensity of the headache: ____________________ Description of the headache: ______________________________________________________________ Hours of sleep the night before the headache: __________ Food or drinks before the headache started: ______________________________________________________________________________________ Events before the headache started: ____________________________________________________________________________________________ Symptoms before the headache started: _________________________________________________________________________________________ Symptoms during the headache: _______________________________________________________________________________________________ Treatment: ________________________________________________________________________________________________________________ Effect of treatment: _________________________________________________________________________________________________________ Other comments: ___________________________________________________________________________________________________________ Date: _______________ Time (from start to end): ____________________ Location of the headache: _________________________ Intensity of the headache: ____________________ Description of the headache: ______________________________________________________________ Hours of sleep the night before the headache: _________ Food or drinks before the headache started: ______________________________________________________________________________________ Events before the headache started:  ____________________________________________________________________________________________ Symptoms before the headache started: _________________________________________________________________________________________ Symptoms during the headache: _______________________________________________________________________________________________ Treatment: ________________________________________________________________________________________________________________ Effect of treatment: _________________________________________________________________________________________________________ Other comments: ___________________________________________________________________________________________________________ Date: _______________ Time (from start to end): ____________________ Location of the headache: _________________________ Intensity of the headache: ____________________ Description of the headache:  ______________________________________________________________ Hours of sleep the night before the headache: _________ Food or drinks before the headache started: ______________________________________________________________________________________ Events before the headache started: ____________________________________________________________________________________________ Symptoms before the headache started: _________________________________________________________________________________________ Symptoms during the headache: _______________________________________________________________________________________________ Treatment: ________________________________________________________________________________________________________________ Effect of treatment: _________________________________________________________________________________________________________ Other comments: ___________________________________________________________________________________________________________ This  information is not intended to replace advice given to you by your health care provider. Make sure you discuss any questions you have with your health care provider. Document Revised: 11/08/2018 Document Reviewed: 11/08/2018 Elsevier Patient Education  2022 [employer].

## 2021-10-10 ENCOUNTER — Other Ambulatory Visit: Payer: Self-pay

## 2021-10-10 ENCOUNTER — Ambulatory Visit: Payer: Self-pay | Admitting: Family

## 2021-10-10 ENCOUNTER — Encounter: Payer: Self-pay | Admitting: Family

## 2021-10-10 VITALS — BP 120/72 | HR 105 | Temp 97.8°F | Resp 18

## 2021-10-10 DIAGNOSIS — J09X2 Influenza due to identified novel influenza A virus with other respiratory manifestations: Secondary | ICD-10-CM

## 2021-10-10 DIAGNOSIS — J4540 Moderate persistent asthma, uncomplicated: Secondary | ICD-10-CM

## 2021-10-10 MED ORDER — ALBUTEROL SULFATE HFA 108 (90 BASE) MCG/ACT IN AERS: 2.0000 | INHALATION_SPRAY | Freq: Four times a day (QID) | RESPIRATORY_TRACT | 3 refills | Status: DC | PRN

## 2021-10-10 NOTE — Progress Notes (Signed)
Acute Office Visit  Subjective:    Patient ID: Susan Wheeler, female    DOB: Nov 17, 1975, 45 y.o.   MRN: [id number]  Chief Complaint  Patient presents with   URI    URI  This is a new problem. The current episode started in the past 7 days. The problem has been unchanged. The maximum temperature recorded prior to her arrival was 101 - 101.9 F. Associated symptoms include congestion, coughing, ear pain, headaches, rhinorrhea, sinus pain, a sore throat and swollen glands. She has tried NSAIDs, decongestant and acetaminophen for the symptoms. The treatment provided mild relief.  Patient is in today for symptoms of URI that started Monday, December 5.  Past Medical History:  Diagnosis Date   Anxiety    BV (bacterial vaginosis) 04/25/2015   Dyspareunia 04/25/2015   GERD (gastroesophageal reflux disease)    Vaginal discharge 04/25/2015   Vaginal itching 04/25/2015    Past Surgical History:  Procedure Laterality Date   CHOLECYSTECTOMY N/A 11/06/2014   Procedure: LAPAROSCOPIC CHOLECYSTECTOMY;  Surgeon: Dalia Heading, MD;  Location: [facility];  Service: General;  Laterality: N/A;   KNEE ARTHROSCOPY Left    MANDIBLE SURGERY     TUBAL LIGATION     VAGINAL HYSTERECTOMY N/A 08/15/2015   Procedure: HYSTERECTOMY VAGINAL;  Surgeon: Lazaro Arms, MD;  Location: [facility];  Service: Gynecology;  Laterality: N/A;    Family History  Problem Relation Age of Onset   Migraines Mother    Asthma Daughter    Asthma Son    Cancer Maternal Grandmother    COPD Maternal Grandfather    Asthma Son     Social History   Socioeconomic History   Marital status: Married    Spouse name: Not on file   Number of children: Not on file   Years of education: Not on file   Highest education level: Not on file  Occupational History   Not on file  Tobacco Use   Smoking status: Never   Smokeless tobacco: Never  Vaping Use   Vaping Use: Never used  Substance and Sexual Activity   Alcohol use: No   Drug use:  No   Sexual activity: Not Currently    Birth control/protection: Surgical    Comment: tubal  Other Topics Concern   Not on file  Social History Narrative   Not on file   Social Determinants of Health   Financial Resource Strain: Not on file  Food Insecurity: Not on file  Transportation Needs: Not on file  Physical Activity: Not on file  Stress: Not on file  Social Connections: Not on file  Intimate Partner Violence: Not on file    Outpatient Medications Prior to Visit  Medication Sig Dispense Refill   acetaminophen (TYLENOL) 500 MG tablet Take 1,000 mg by mouth every 6 (six) hours as needed.     albuterol (VENTOLIN HFA) 108 (90 Base) MCG/ACT inhaler Inhale 2 puffs into the lungs every 6 (six) hours as needed for wheezing or shortness of breath. 8 g 1   ibuprofen (ADVIL) 600 MG tablet Take 600 mg by mouth every 6 (six) hours as needed.     SUMAtriptan-naproxen (TREXIMET) 85-500 MG tablet Take 1 tablet at onset of migraine headache. May repeat dose as needed after 2 hours. Do not exceed two doses in 24 hours. 10 tablet 0   No facility-administered medications prior to visit.    Allergies  Allergen Reactions   Codeine Hives   Dilaudid [Hydromorphone] Nausea  And Vomiting   Penicillins Hives and Nausea And Vomiting    Has patient had a PCN reaction causing immediate rash, facial/tongue/throat swelling, SOB or lightheadedness with hypotension: No Has patient had a PCN reaction causing severe rash involving mucus membranes or skin necrosis: No Has patient had a PCN reaction that required hospitalization No Has patient had a PCN reaction occurring within the last 10 years: No If all of the above answers are "NO", then may proceed with Cephalosporin use.     Review of Systems  Constitutional:  Positive for activity change, fatigue and fever.  HENT:  Positive for congestion, ear pain, rhinorrhea, sinus pain and sore throat.   Respiratory:  Positive for cough.   Neurological:   Positive for headaches.      Objective:    Physical Exam HENT:     Right Ear: Tympanic membrane normal.     Left Ear: Tympanic membrane normal.     Nose: Congestion and rhinorrhea present.     Mouth/Throat:     Pharynx: Posterior oropharyngeal erythema present.  Eyes:     Pupils: Pupils are equal, round, and reactive to light.  Cardiovascular:     Rate and Rhythm: Tachycardia present.  Pulmonary:     Breath sounds: Normal breath sounds.  Lymphadenopathy:     Cervical: Cervical adenopathy present.  Skin:    General: Skin is warm.  Neurological:     Mental Status: She is alert.    BP 120/72 (BP Location: Right Arm)   Pulse (!) 105   Temp 97.8 F (36.6 C)   Resp 18   LMP 08/10/2015   SpO2 98%  Wt Readings from Last 3 Encounters:  03/28/21 195 lb (88.5 kg)  02/05/21 196 lb (88.9 kg)  04/11/18 190 lb (86.2 kg)    Health Maintenance Due  Topic Date Due   COVID-19 Vaccine (1) Never done   Hepatitis C Screening  Never done   TETANUS/TDAP  Never done   PAP SMEAR-Modifier  06/25/2018   INFLUENZA VACCINE  Never done    There are no preventive care reminders to display for this patient.   No results found for: TSH Lab Results  Component Value Date   WBC 6.0 02/12/2021   HGB 13.6 02/12/2021   HCT 42.0 02/12/2021   MCV 86.1 02/12/2021   PLT 274 02/12/2021   Lab Results  Component Value Date   NA 137 02/12/2021   K 4.1 02/12/2021   CO2 21 02/12/2021   GLUCOSE 97 02/12/2021   BUN 15 02/12/2021   CREATININE 0.79 02/12/2021   BILITOT 0.7 02/12/2021   ALKPHOS 47 08/10/2015   AST 20 02/12/2021   ALT 18 02/12/2021   PROT 6.9 02/12/2021   ALBUMIN 3.9 08/10/2015   CALCIUM 9.1 02/12/2021   ANIONGAP 4 (L) 08/16/2015   Lab Results  Component Value Date   CHOL 139 02/12/2021   Lab Results  Component Value Date   HDL 45 (L) 02/12/2021   Lab Results  Component Value Date   LDLCALC 76 02/12/2021   Lab Results  Component Value Date   TRIG 98 02/12/2021    Lab Results  Component Value Date   CHOLHDL 3.1 02/12/2021   Lab Results  Component Value Date   HGBA1C 4.9 02/12/2021       Assessment & Plan:   Came to the office with flulike symptoms. Was tested for coated with rapid antigen and influenza type AMD with rapid antigen. Came back positive for influenza type  A.  She's currently having symptoms of fever, body aches, sore throat, congestion, ear pain, and cough. Also has a history of asthma.   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 Currently she continues to have a spike in fevers of about 101. Discuss the need for her to remain off work until fevers subside for a minimum of 24 hours.   At this time she should refrain from working until she is fever free for a minimum of 24 hours. Continue symptom management with over-the-counter preparations. Will order albuterol inhaler.  If asthma becomes uncontrollable she should go to the emergency department right away.  Return to the Employee health clinic as needed.  Liliane Channel, NP

## 2022-01-23 DIAGNOSIS — F32A Depression, unspecified: Secondary | ICD-10-CM

## 2022-01-23 DIAGNOSIS — R1310 Dysphagia, unspecified: Secondary | ICD-10-CM | POA: Insufficient documentation

## 2022-01-23 DIAGNOSIS — J45909 Unspecified asthma, uncomplicated: Secondary | ICD-10-CM | POA: Insufficient documentation

## 2022-01-23 DIAGNOSIS — K219 Gastro-esophageal reflux disease without esophagitis: Secondary | ICD-10-CM | POA: Insufficient documentation

## 2022-01-23 DIAGNOSIS — F419 Anxiety disorder, unspecified: Secondary | ICD-10-CM | POA: Insufficient documentation

## 2022-01-23 DIAGNOSIS — R9431 Abnormal electrocardiogram [ECG] [EKG]: Secondary | ICD-10-CM | POA: Insufficient documentation

## 2022-01-23 DIAGNOSIS — R030 Elevated blood-pressure reading, without diagnosis of hypertension: Secondary | ICD-10-CM | POA: Insufficient documentation

## 2022-01-23 HISTORY — DX: Unspecified asthma, uncomplicated: J45.909

## 2022-01-23 HISTORY — DX: Depression, unspecified: F32.A

## 2022-03-27 ENCOUNTER — Other Ambulatory Visit (HOSPITAL_BASED_OUTPATIENT_CLINIC_OR_DEPARTMENT_OTHER): Payer: Self-pay | Admitting: Emergency Medicine

## 2022-03-27 DIAGNOSIS — Z1231 Encounter for screening mammogram for malignant neoplasm of breast: Secondary | ICD-10-CM

## 2022-04-18 ENCOUNTER — Ambulatory Visit (HOSPITAL_BASED_OUTPATIENT_CLINIC_OR_DEPARTMENT_OTHER)
Admission: RE | Admit: 2022-04-18 | Discharge: 2022-04-18 | Disposition: A | Discharge status: Home / Self Care | Payer: No Typology Code available for payment source | Source: Ambulatory Visit | Attending: Emergency Medicine | Admitting: Emergency Medicine

## 2022-04-18 ENCOUNTER — Encounter (HOSPITAL_BASED_OUTPATIENT_CLINIC_OR_DEPARTMENT_OTHER): Payer: Self-pay | Admitting: Radiology

## 2022-04-18 DIAGNOSIS — Z1231 Encounter for screening mammogram for malignant neoplasm of breast: Secondary | ICD-10-CM | POA: Insufficient documentation

## 2022-07-22 ENCOUNTER — Ambulatory Visit: Payer: No Typology Code available for payment source | Admitting: Nurse Practitioner

## 2022-07-22 ENCOUNTER — Encounter: Payer: Self-pay | Admitting: Nurse Practitioner

## 2022-07-22 VITALS — BP 118/70 | HR 83

## 2022-07-22 DIAGNOSIS — Z789 Other specified health status: Secondary | ICD-10-CM

## 2022-07-22 DIAGNOSIS — J4520 Mild intermittent asthma, uncomplicated: Secondary | ICD-10-CM

## 2022-07-22 MED ORDER — ALBUTEROL SULFATE HFA 108 (90 BASE) MCG/ACT IN AERS: 2.0000 | INHALATION_SPRAY | Freq: Four times a day (QID) | RESPIRATORY_TRACT | 1 refills | Status: DC | PRN

## 2022-07-22 NOTE — Progress Notes (Signed)
Office Visit  Subjective:  Patient ID: Susan Wheeler, female    DOB: [birth year]  Age: 46 y.o. MRN: [id number]  CC: wellness exam   HPI PEARLEY MILLINGTON presents for wellness exam visit for insurance benefit.  Patient has a PCP: Dr. Gracy Racer in [place].  PMH significant for: Asthma and seasonal allergies.  Last labs per PCP were completed: May 2023  Health Maintenance:  Mammogram: June 2023 Pap: Partial Hysterectomy.     Smoker:Never  Immunizations:  Tdap:unsure of date last completed.  COVID- x3  Lifestyle: Diet- avoiding sweets. Currently on phentermine for weightloss. Has lost 15 pounds over the past 3 months.  Exercise- reports active at work.      Past Medical History:  Diagnosis Date   Anxiety    BV (bacterial vaginosis) 04/25/2015   Dyspareunia 04/25/2015   GERD (gastroesophageal reflux disease)    Vaginal discharge 04/25/2015   Vaginal itching 04/25/2015    Past Surgical History:  Procedure Laterality Date   CHOLECYSTECTOMY N/A 11/06/2014   Procedure: LAPAROSCOPIC CHOLECYSTECTOMY;  Surgeon: Jamesetta So, MD;  Location: [facility];  Service: General;  Laterality: N/A;   KNEE ARTHROSCOPY Left    MANDIBLE SURGERY     TUBAL LIGATION     VAGINAL HYSTERECTOMY N/A 08/15/2015   Procedure: HYSTERECTOMY VAGINAL;  Surgeon: Florian Buff, MD;  Location: [facility];  Service: Gynecology;  Laterality: N/A;    Outpatient Medications Prior to Visit  Medication Sig Dispense Refill   phentermine (ADIPEX-P) 37.5 MG tablet Take 37.5 mg by mouth daily before breakfast.     albuterol (VENTOLIN HFA) 108 (90 Base) MCG/ACT inhaler Inhale 2 puffs into the lungs every 6 (six) hours as needed for wheezing or shortness of breath. 8 g 1   acetaminophen (TYLENOL) 500 MG tablet Take 1,000 mg by mouth every 6 (six) hours as needed.     ibuprofen (ADVIL) 600 MG tablet Take 600 mg by mouth every 6 (six) hours as needed.     albuterol (VENTOLIN HFA) 108 (90 Base) MCG/ACT inhaler Inhale 2 puffs into  the lungs every 6 (six) hours as needed for wheezing or shortness of breath. 8 g 3   SUMAtriptan-naproxen (TREXIMET) 85-500 MG tablet Take 1 tablet at onset of migraine headache. May repeat dose as needed after 2 hours. Do not exceed two doses in 24 hours. 10 tablet 0   No facility-administered medications prior to visit.    ROS Review of Systems  Respiratory:  Negative for shortness of breath.   Cardiovascular:  Negative for chest pain and leg swelling.  Gastrointestinal:  Positive for constipation. Negative for diarrhea.  Allergic/Immunologic: Positive for environmental allergies.  Neurological:  Negative for headaches.    Objective:  BP 118/70   Pulse 83   LMP 08/06/2015 (Approximate) Comment: hysterectomy  SpO2 99%   Physical Exam Constitutional:      General: She is not in acute distress. HENT:     Head: Normocephalic.  Cardiovascular:     Rate and Rhythm: Normal rate and regular rhythm.     Heart sounds: Normal heart sounds.  Pulmonary:     Effort: Pulmonary effort is normal.     Breath sounds: Normal breath sounds.  Musculoskeletal:        General: Normal range of motion.     Right lower leg: No edema.     Left lower leg: No edema.  Skin:    General: Skin is warm.  Neurological:     General: No focal  deficit present.     Mental Status: She is alert and oriented to person, place, and time.  Psychiatric:        Mood and Affect: Mood normal.        Behavior: Behavior normal.      Assessment & Plan:    Amyrah was seen today for wellness exam.  Diagnoses and all orders for this visit:  Participant in health and wellness plan Adult wellness physical was conducted today. Importance of diet and exercise were discussed in detail.  We reviewed immunizations and gave recommendations regarding current immunization needed for age. Patient will follow-up if she needs Tdap.  Preventative health exams up to date.  Patient was advised yearly wellness exam and  follow-up with PCP as scheduled.   Asthma in adult, mild intermittent, uncomplicated Requesting refill for albuterol.  -     albuterol (VENTOLIN HFA) 108 (90 Base) MCG/ACT inhaler; Inhale 2 puffs into the lungs every 6 (six) hours as needed for wheezing or shortness of breath.    No orders of the defined types were placed in this encounter.   Meds ordered this encounter  Medications   albuterol (VENTOLIN HFA) 108 (90 Base) MCG/ACT inhaler    Sig: Inhale 2 puffs into the lungs every 6 (six) hours as needed for wheezing or shortness of breath.    Dispense:  8 g    Refill:  1    Follow-up: as needed.

## 2022-11-25 ENCOUNTER — Other Ambulatory Visit: Payer: Self-pay

## 2022-11-25 ENCOUNTER — Emergency Department (HOSPITAL_COMMUNITY): Payer: PRIVATE HEALTH INSURANCE

## 2022-11-25 ENCOUNTER — Emergency Department (HOSPITAL_COMMUNITY)
Admission: EM | Admit: 2022-11-25 | Discharge: 2022-11-25 | Disposition: A | Discharge status: Home / Self Care | Payer: PRIVATE HEALTH INSURANCE | Attending: Emergency Medicine | Admitting: Emergency Medicine

## 2022-11-25 ENCOUNTER — Encounter (HOSPITAL_COMMUNITY): Payer: Self-pay

## 2022-11-25 ENCOUNTER — Ambulatory Visit: Payer: No Typology Code available for payment source | Admitting: Nurse Practitioner

## 2022-11-25 ENCOUNTER — Encounter: Payer: Self-pay | Admitting: Nurse Practitioner

## 2022-11-25 VITALS — BP 134/84 | HR 93

## 2022-11-25 DIAGNOSIS — G4453 Primary thunderclap headache: Secondary | ICD-10-CM

## 2022-11-25 DIAGNOSIS — R519 Headache, unspecified: Secondary | ICD-10-CM | POA: Diagnosis not present

## 2022-11-25 DIAGNOSIS — G43109 Migraine with aura, not intractable, without status migrainosus: Secondary | ICD-10-CM

## 2022-11-25 LAB — CBC WITH DIFFERENTIAL/PLATELET
Abs Immature Granulocytes: 0.01 10*3/uL (ref 0.00–0.07)
Basophils Absolute: 0.1 10*3/uL (ref 0.0–0.1)
Basophils Relative: 1 %
Eosinophils Absolute: 0.3 10*3/uL (ref 0.0–0.5)
Eosinophils Relative: 4 %
HCT: 41.1 % (ref 36.0–46.0)
Hemoglobin: 13.8 g/dL (ref 12.0–15.0)
Immature Granulocytes: 0 %
Lymphocytes Relative: 23 %
Lymphs Abs: 1.9 10*3/uL (ref 0.7–4.0)
MCH: 28.6 pg (ref 26.0–34.0)
MCHC: 33.6 g/dL (ref 30.0–36.0)
MCV: 85.1 fL (ref 80.0–100.0)
Monocytes Absolute: 0.5 10*3/uL (ref 0.1–1.0)
Monocytes Relative: 6 %
Neutro Abs: 5.4 10*3/uL (ref 1.7–7.7)
Neutrophils Relative %: 66 %
Platelets: 259 10*3/uL (ref 150–400)
RBC: 4.83 MIL/uL (ref 3.87–5.11)
RDW: 13.1 % (ref 11.5–15.5)
WBC: 8.1 10*3/uL (ref 4.0–10.5)
nRBC: 0 % (ref 0.0–0.2)

## 2022-11-25 LAB — BASIC METABOLIC PANEL
Anion gap: 10 (ref 5–15)
BUN: 14 mg/dL (ref 6–20)
CO2: 23 mmol/L (ref 22–32)
Calcium: 9.3 mg/dL (ref 8.9–10.3)
Chloride: 104 mmol/L (ref 98–111)
Creatinine, Ser: 0.89 mg/dL (ref 0.44–1.00)
GFR, Estimated: >60 mL/min (ref >60)
Glucose, Bld: 87 mg/dL (ref 70–99)
Potassium: 3.8 mmol/L (ref 3.5–5.1)
Sodium: 137 mmol/L (ref 135–145)

## 2022-11-25 MED ORDER — IOHEXOL 350 MG/ML SOLN
75.0000 mL | Freq: Once | INTRAVENOUS | Status: AC | PRN
  Administered 2022-11-25: 75 mL via INTRAVENOUS

## 2022-11-25 NOTE — ED Notes (Signed)
ED Provider at bedside. 

## 2022-11-25 NOTE — Discharge Instructions (Signed)
If you develop continued, recurrent, or worsening headache, fever, neck stiffness, vomiting, blurry or double vision, weakness or numbness in your arms or legs, trouble speaking, or any other new/concerning symptoms then return to the ER for evaluation.  

## 2022-11-25 NOTE — ED Triage Notes (Signed)
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  NP told her to come to the ER because she was having "thunderclap headaches". Pt does not currently have a headache.

## 2022-11-25 NOTE — ED Notes (Signed)
Patient verbalizes understanding of discharge instructions. Opportunity for questioning and answers were provided. Armband removed by staff, pt discharged from ED. Ambulated out to lobby with spouse  

## 2022-11-25 NOTE — ED Provider Notes (Signed)
[facility] Provider Note   CSN: [id number] Arrival date & time: 11/25/22  1810     History {Add pertinent medical, surgical, social history, OB history to HPI:1} Chief Complaint  Patient presents with   Headache    Susan Wheeler is a 47 y.o. female.  HPI 47 year old female presents for evaluation of headaches.  Saw a provider at work today at the employee clinic and was told to go to the ER.  She has been having severe, sudden headaches to reach maximum intensity almost instantly on and off for over a week and her significant other at the bedside says for about a month.  Sometimes these are right-sided pains and sometimes left-sided.  No visual disturbances, vomiting, weakness, numbness, neck stiffness, fever.  She has a history of migraines though these feel different.  She has been taking Imitrex nearly every day.  Used to follow-up with a headache specialist in [place] but that clinic close down.  Last episode of headache was yesterday, no headache today.  Home Medications Prior to Admission medications   Medication Sig Start Date End Date Taking? Authorizing Provider  acetaminophen (TYLENOL) 500 MG tablet Take 1,000 mg by mouth every 6 (six) hours as needed.    [provider]  albuterol (VENTOLIN HFA) 108 (90 Base) MCG/ACT inhaler Inhale 2 puffs into the lungs every 6 (six) hours as needed for wheezing or shortness of breath. 07/22/22   Lurena Joiner, NP  famotidine (PEPCID) 20 MG tablet Take 20 mg by mouth 2 (two) times daily.    [provider]  ibuprofen (ADVIL) 600 MG tablet Take 600 mg by mouth every 6 (six) hours as needed.    [provider]  pantoprazole (PROTONIX) 40 MG tablet Take 40 mg by mouth 2 (two) times daily.    [provider]  phentermine (ADIPEX-P) 37.5 MG tablet Take 37.5 mg by mouth daily before breakfast. Patient not taking: Reported on 11/25/2022    [provider]  SUMAtriptan (IMITREX) 50 MG tablet SMARTSIG:1 Tablet(s) By Mouth 1-2 Times Daily 10/23/22   [provider]      Allergies    Codeine, Dilaudid [hydromorphone], and Penicillins    Review of Systems   Review of Systems  Constitutional:  Negative for fever.  Eyes:  Negative for visual disturbance.  Gastrointestinal:  Negative for vomiting.  Musculoskeletal:  Negative for neck stiffness.  Neurological:  Positive for headaches. Negative for weakness and numbness.    Physical Exam Updated Vital Signs BP 133/84 (BP Location: Right Arm)   Pulse 71   Temp 98 F (36.7 C) (Oral)   Resp 16   Ht 5\' 3"  (1.6 m)   Wt 81.6 kg   LMP 08/06/2015 (Approximate) Comment: hysterectomy  SpO2 99%   BMI 31.89 kg/m  Physical Exam Vitals and nursing note reviewed.  Constitutional:      Appearance: She is well-developed.  HENT:     Head: Normocephalic and atraumatic.  Eyes:     Extraocular Movements: Extraocular movements intact.     Pupils: Pupils are equal, round, and reactive to light.  Cardiovascular:     Rate and Rhythm: Normal rate and regular rhythm.     Heart sounds: Normal heart sounds.  Pulmonary:     Effort: Pulmonary effort is normal.     Breath sounds: Normal breath sounds.  Abdominal:     Palpations: Abdomen is soft.     Tenderness: There is  no abdominal tenderness.  Musculoskeletal:     Cervical back: Normal range of motion. No rigidity.  Skin:    General: Skin is warm and dry.  Neurological:     Mental Status: She is alert.     Comments: CN 3-12 grossly intact. 5/5 strength in all 4 extremities. Grossly normal sensation. Normal finger to nose.      ED Results / Procedures / Treatments   Labs (all labs ordered are listed, but only abnormal results are displayed) Labs Reviewed - No data to display  EKG None  Radiology No results found.  Procedures Procedures  {Document cardiac monitor, telemetry assessment procedure when  appropriate:1}  Medications Ordered in ED Medications - No data to display  ED Course/ Medical Decision Making/ A&P   {   Click here for ABCD2, HEART and other calculatorsREFRESH Note before signing :1}                          Medical Decision Making Amount and/or Complexity of Data Reviewed Labs: ordered. Radiology: ordered.   ***  {Document critical care time when appropriate:1} {Document review of labs and clinical decision tools ie heart score, Chads2Vasc2 etc:1}  {Document your independent review of radiology images, and any outside records:1} {Document your discussion with family members, caretakers, and with consultants:1} {Document social determinants of health affecting pt's care:1} {Document your decision making why or why not admission, treatments were needed:1} Final Clinical Impression(s) / ED Diagnoses Final diagnoses:  None    Rx / DC Orders ED Discharge Orders     None

## 2022-11-25 NOTE — Progress Notes (Signed)
[facility]   Subjective:  Patient ID: Susan Wheeler, female    DOB: October 14, 1976  Age: 47 y.o. MRN: [id number]  CC: Headache  HPI Susan Wheeler presents for c/o of headaches.  Reports increased worsening headaches over the past few weeks. She reports two types of headaches. Sometimes she has migraines, which she reports she has had in the past. These cause photophobia, phonophobia. Migraines have been present for a month.  However, she also reports experiencing random headaches over the past week that are severe. She describes them as "thunderclap " headaches. States headache is sharp and pain will awaken her from sleep. She also experiences blurry vision, dizziness and vertigo. States these headaches are the worst in her life. Denies recent trauma or unilateral weakness Reports she is experiencing headaches daily. Currently denies headache at this time.   She has tried several otc medications including ibuprofen, tylenol, Excedrin, migraine relief (otc), all have been ineffective.  Reports she called her PCP office in Dec 2023 and was given Imitrex. This seemed to help with migraines. However, she has run out of this and her pcp office is now closed permanently.  She also mentions she was recently on phentermine which she stopped 2 weeks ago due to headaches.   Past Medical History:  Diagnosis Date   Anxiety    BV (bacterial vaginosis) 04/25/2015   Dyspareunia 04/25/2015   GERD (gastroesophageal reflux disease)    Vaginal discharge 04/25/2015   Vaginal itching 04/25/2015    Past Surgical History:  Procedure Laterality Date   CHOLECYSTECTOMY N/A 11/06/2014   Procedure: LAPAROSCOPIC CHOLECYSTECTOMY;  Surgeon: Jamesetta So, MD;  Location: [facility];  Service: General;  Laterality: N/A;   KNEE ARTHROSCOPY Left    MANDIBLE SURGERY     TUBAL LIGATION     VAGINAL HYSTERECTOMY N/A 08/15/2015   Procedure: HYSTERECTOMY VAGINAL;  Surgeon: Florian Buff, MD;  Location: [facility];  Service: Gynecology;  Laterality: N/A;    Outpatient Medications Prior to Visit  Medication Sig Dispense Refill   albuterol (VENTOLIN HFA) 108 (90 Base) MCG/ACT inhaler Inhale 2 puffs into the lungs every 6 (six) hours as needed for wheezing or shortness of breath. 8 g 1   SUMAtriptan (IMITREX) 50 MG tablet SMARTSIG:1 Tablet(s) By Mouth 1-2 Times Daily     acetaminophen (TYLENOL) 500 MG tablet Take 1,000 mg by mouth every 6 (six) hours as needed.     famotidine (PEPCID) 20 MG tablet Take 20 mg by mouth 2 (two) times daily.     ibuprofen (ADVIL) 600 MG tablet Take 600 mg by mouth every 6 (six) hours as needed.     pantoprazole (PROTONIX) 40 MG tablet Take 40 mg by mouth 2 (two) times daily.     phentermine (ADIPEX-P) 37.5 MG tablet Take 37.5 mg by mouth daily before breakfast. (Patient not taking: Reported on 11/25/2022)     No facility-administered medications prior to visit.    ROS Review of Systems  Constitutional:  Negative for appetite change and fever.  Respiratory:  Negative for shortness of breath.   Cardiovascular:  Positive for chest pain (ocassionaly, has discussed this with PCP).  Neurological:  Positive for dizziness (sometimes) and headaches. Negative for weakness and numbness.  Psychiatric/Behavioral:  Negative for confusion.     Objective:  BP 134/84   Pulse 93   LMP 08/06/2015 (Approximate) Comment: hysterectomy  SpO2 100%   Physical Exam Constitutional:      General: She is not  in acute distress. HENT:     Head: Normocephalic.  Eyes:     General: No visual field deficit.    Extraocular Movements: Extraocular movements intact.     Pupils: Pupils are equal, round, and reactive to light.  Cardiovascular:     Rate and Rhythm: Normal rate and regular rhythm.  Pulmonary:     Effort: Pulmonary effort is normal.  Musculoskeletal:        General: Normal range of motion.  Neurological:     Mental Status: She is alert and oriented to person, place, and time.      Cranial Nerves: No facial asymmetry.     Sensory: No sensory deficit.     Motor: No weakness.  Psychiatric:        Mood and Affect: Mood normal.      Assessment & Plan:    Susan Wheeler was seen today for headache.  Diagnoses and all orders for this visit:  Migraine with aura and without status migrainosus, not intractable Thunderclap headache Hx of migraines although today she reports some concerning symptoms including reports of  thunderclap headache, severe pain, and vertigo. Due to this, I have recommended patient go to ED for further imaging and work up. Also recommended establish with a PCP for ongoing management.    No orders of the defined types were placed in this encounter.   No orders of the defined types were placed in this encounter.   Follow-up: Go to ED for further eval.

## 2022-11-25 NOTE — ED Notes (Signed)
Patient transported to CT 

## 2023-03-09 IMAGING — MG MM DIGITAL SCREENING BILAT W/ TOMO AND CAD
6 of 12 series · 6 of 36 positions shown · non-contrast
Comparison: None available.

CLINICAL DATA: Screening.

EXAM:
DIGITAL SCREENING BILATERAL MAMMOGRAM WITH TOMOSYNTHESIS AND CAD
TECHNIQUE: Bilateral screening digital craniocaudal and mediolateral oblique
mammograms were obtained. Bilateral screening digital breast
tomosynthesis was performed. The images were evaluated with
computer-aided detection.

[L CC synth-2D]
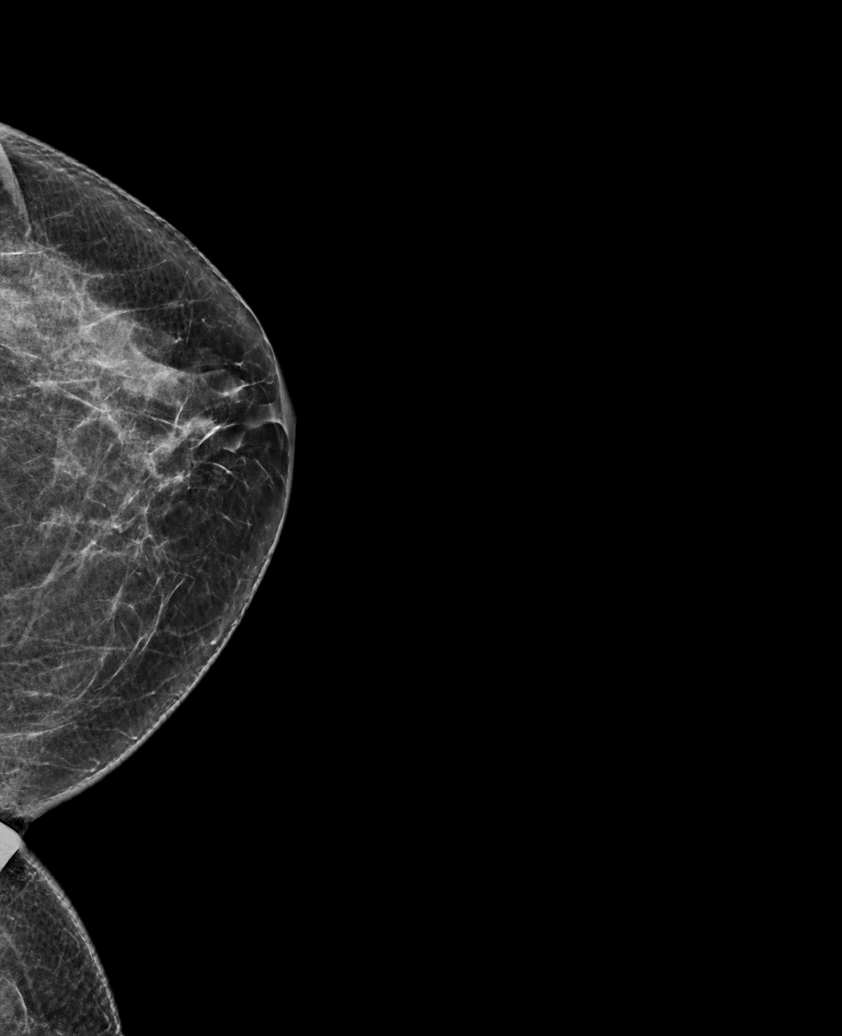

[R CC synth-2D]
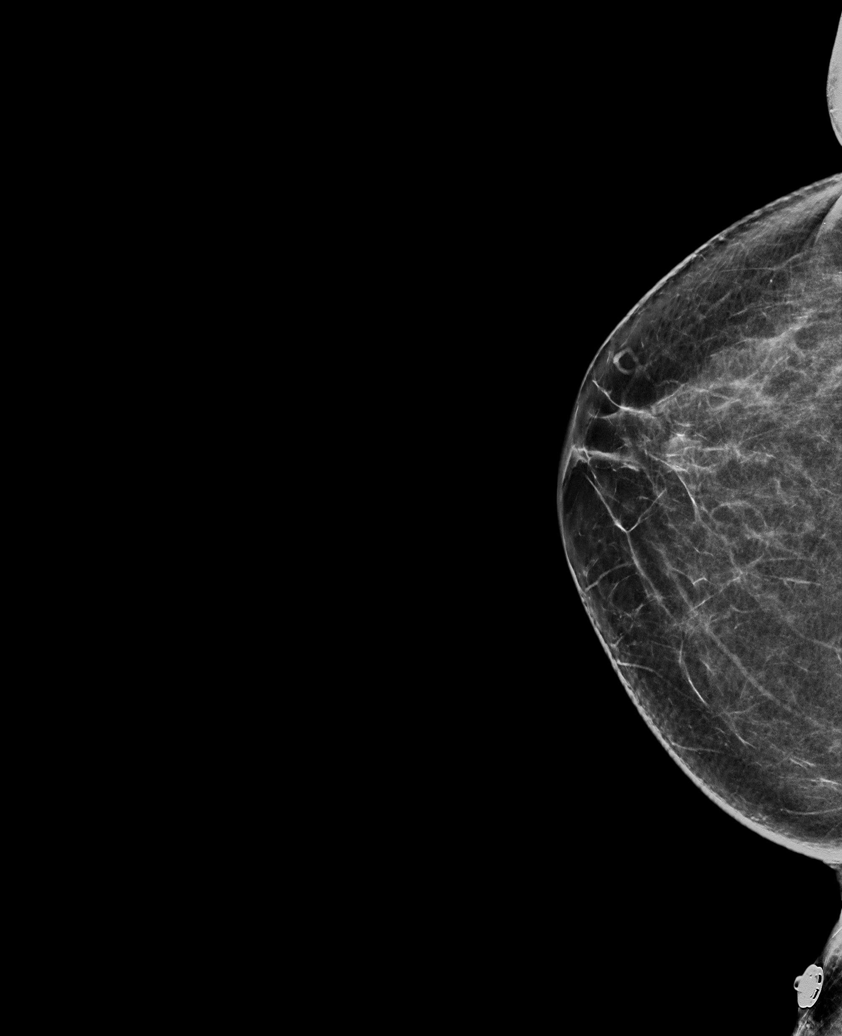

[R MLO synth-2D]
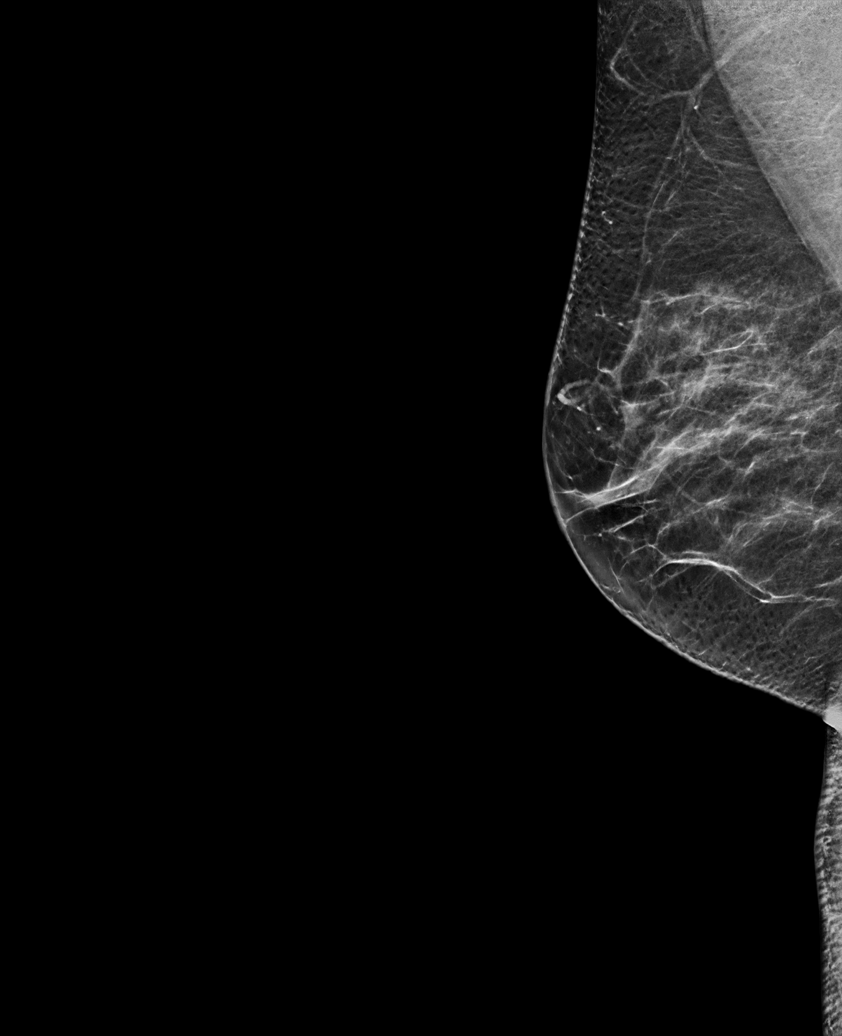

[R XCCL synth-2D]
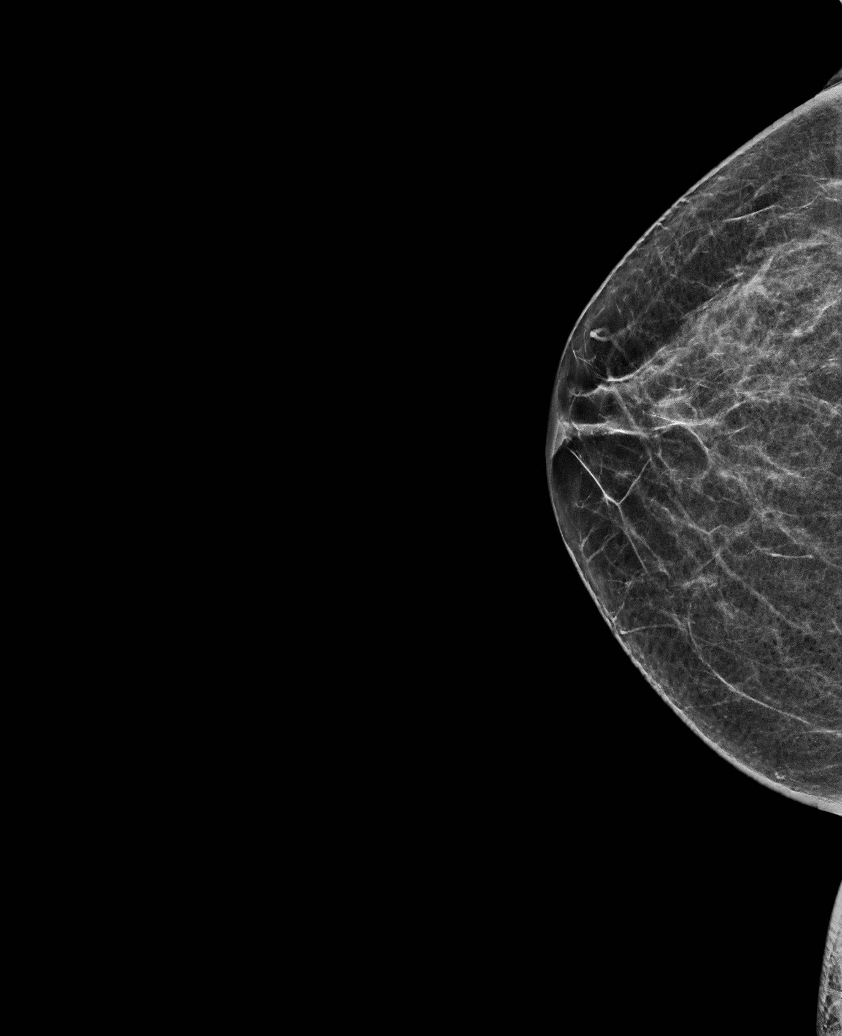

[L XCCL synth-2D]
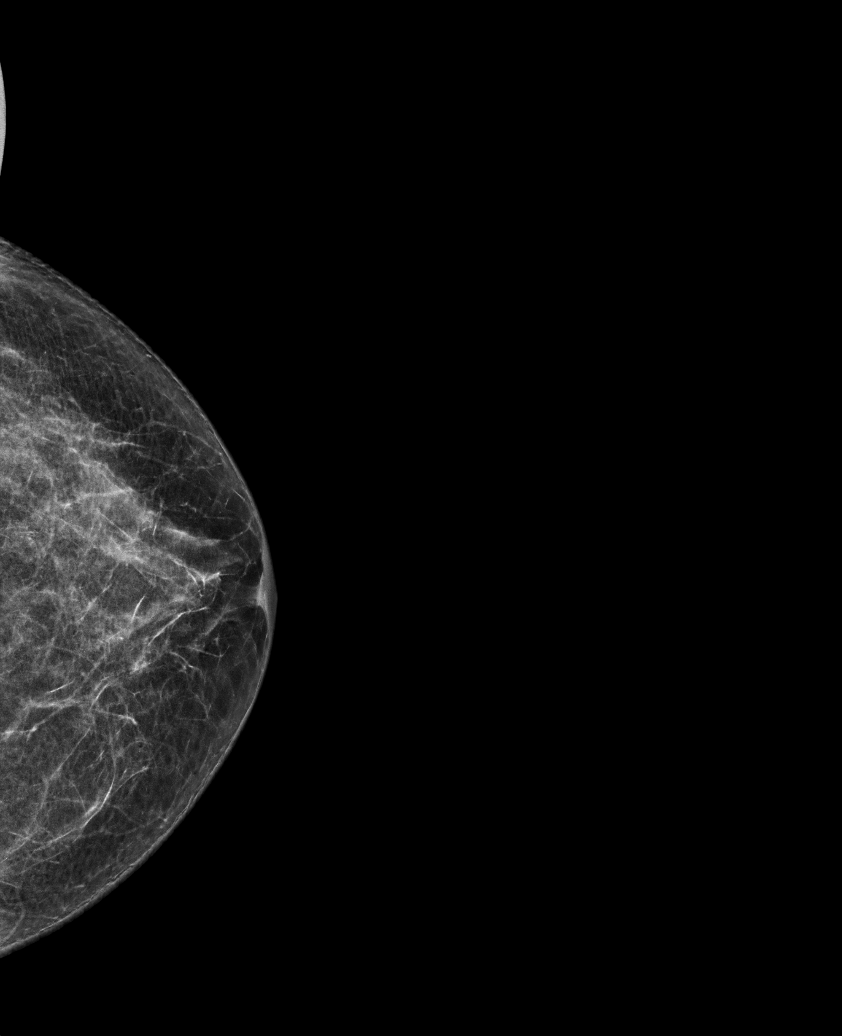

[L MLO synth-2D]
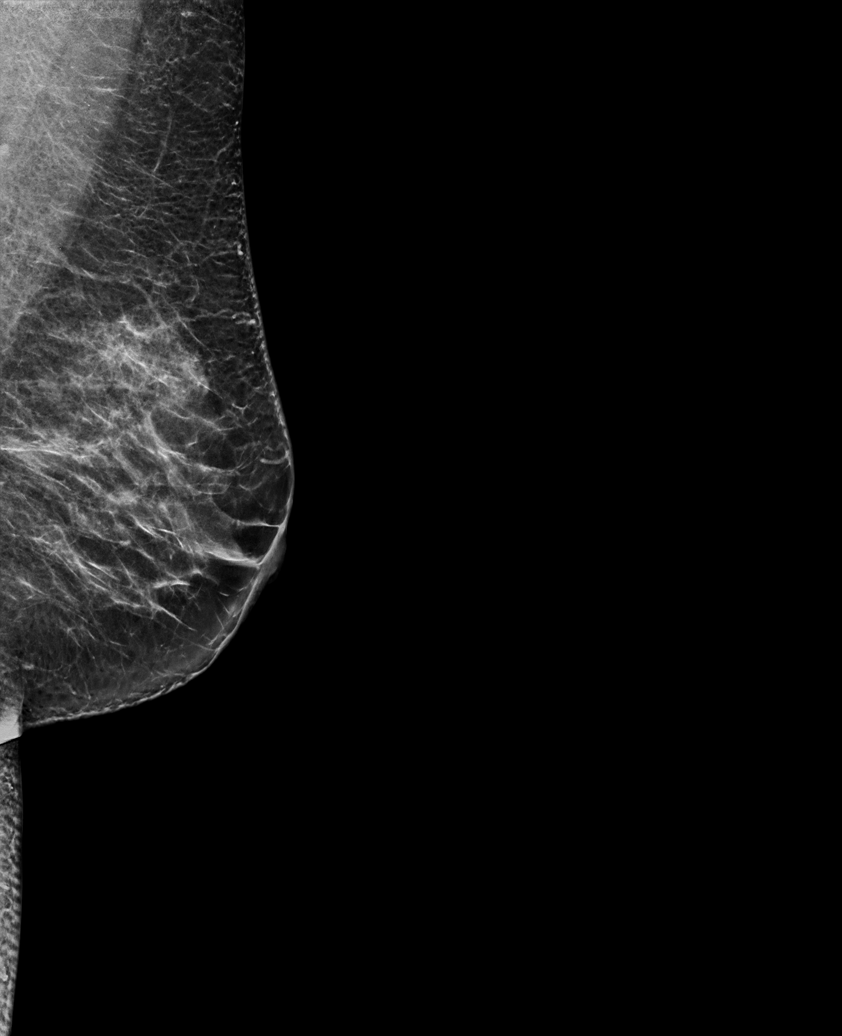

[6 of 36 positions shown; findings below may reference images not displayed]

ACR Breast Density Category c: The breast tissue is heterogeneously
dense, which may obscure small masses
FINDINGS: There are no findings suspicious for malignancy.
IMPRESSION: No mammographic evidence of malignancy. A result letter of this
screening mammogram will be mailed directly to the patient.

RECOMMENDATION:
Screening mammogram in one year. (Code:A7-O-PCM)

BI-RADS CATEGORY  1: Negative.

## 2023-04-30 ENCOUNTER — Encounter: Payer: Self-pay | Admitting: Nurse Practitioner

## 2023-04-30 ENCOUNTER — Ambulatory Visit: Payer: PRIVATE HEALTH INSURANCE | Admitting: Nurse Practitioner

## 2023-04-30 VITALS — BP 128/93 | HR 87

## 2023-04-30 DIAGNOSIS — F419 Anxiety disorder, unspecified: Secondary | ICD-10-CM

## 2023-04-30 DIAGNOSIS — G43009 Migraine without aura, not intractable, without status migrainosus: Secondary | ICD-10-CM

## 2023-04-30 MED ORDER — SUMATRIPTAN SUCCINATE 50 MG PO TABS: ORAL_TABLET | ORAL | 0 refills | Status: DC

## 2023-04-30 MED ORDER — HYDROXYZINE PAMOATE 25 MG PO CAPS: ORAL_CAPSULE | ORAL | 0 refills | Status: DC

## 2023-04-30 NOTE — Progress Notes (Signed)
Acute Office Visit  Subjective:     Patient ID: Susan Wheeler, female    DOB: Jun 07, 1976, 47 y.o.   MRN: [id number]  Chief Complaint  Patient presents with   Migraine   Patient is in today for c/o of migraines headaches. Not new, previously managed by PCP. Has never seen a specialist.  Reports migraine occur 1-2 x a week.  She is not sure what triggers these. Sometimes headaches starts as a tension headache and progress to a migraine. Denies dizziness but reports some blurred vision during migraines.  Uses Tylenol and Advil first, if it doesn't improve, she tries Imitrex but reports she has not this often as she has almost run out of the prescription.  Thinks she may have anxiety that is contributing to headaches.  Her previous PCP office closed. Currently does not have PCP.   Review of Systems  Respiratory:  Negative for shortness of breath.   Cardiovascular:  Positive for palpitations (denies currently but reports with anxiety). Negative for chest pain (denies at this time but has felt this in the past.).  Neurological:  Positive for headaches. Negative for weakness.  Psychiatric/Behavioral:  Positive for depression (reports possibly ocassionally). Negative for suicidal ideas. The patient is nervous/anxious.        Objective:    BP (!) 128/93   Pulse 87   LMP 08/06/2015 (Approximate) Comment: hysterectomy  SpO2 98%    Physical Exam Constitutional:      General: She is not in acute distress. HENT:     Head: Normocephalic.  Eyes:     Pupils: Pupils are equal, round, and reactive to light.  Cardiovascular:     Rate and Rhythm: Normal rate and regular rhythm.     Heart sounds: Normal heart sounds.  Pulmonary:     Effort: Pulmonary effort is normal.     Breath sounds: Normal breath sounds.  Musculoskeletal:        General: Normal range of motion.  Neurological:     General: No focal deficit present.     Mental Status: She is alert and oriented to person, place, and  time. Mental status is at baseline.     Motor: No weakness.     Gait: Gait normal.     No results found for any visits on 04/30/23.  GAD-7 Score 15 today.        Assessment & Plan:   Problem List Items Addressed This Visit   None Visit Diagnoses     Migraine without aura and without status migrainosus, not intractable    -  Primary   Relevant Medications   SUMAtriptan (IMITREX) 50 MG tablet  Discussed that she may benefit from preventative treatment but will try to manage anxiety symptoms and refill her Imitrex prescription. I encouraged patient establish care with a PCP. New PCP ppointment scheduled for July 23.     Anxiety       Relevant Medications   hydrOXYzine (VISTARIL) 25 MG capsule GAD-7 is 15 today. Will trial hydroxyzine as needed for anxiety. Discussed coping mechanisms  and encouraged pursing behavioral health therapy. Has PCP appointment in 3-4 weeks for further management. Will follow-up in 2 weeks to revaluate symptoms.        Meds ordered this encounter  Medications   SUMAtriptan (IMITREX) 50 MG tablet    Sig: SMARTSIG:1 Tablet(s) By Mouth 1-2 Times Daily    Dispense:  10 tablet    Refill:  0    Order Specific Question:  Supervising Provider    Answer:   Erasmo Downer [[id number]]   hydrOXYzine (VISTARIL) 25 MG capsule    Sig: Take 1-2 capsules up to 4 times a day as needed.    Dispense:  30 capsule    Refill:  0    Order Specific Question:   Supervising Provider    Answer:   Erasmo Downer [[id number]]    Follow-up in 2 weeks.   Gloris Ham, NP

## 2023-05-12 ENCOUNTER — Encounter: Payer: Self-pay | Admitting: Nurse Practitioner

## 2023-05-12 ENCOUNTER — Ambulatory Visit: Payer: PRIVATE HEALTH INSURANCE | Admitting: Nurse Practitioner

## 2023-05-12 VITALS — BP 118/84 | HR 68 | Ht 63.0 in | Wt 188.0 lb

## 2023-05-12 DIAGNOSIS — Z789 Other specified health status: Secondary | ICD-10-CM

## 2023-05-12 DIAGNOSIS — J3089 Other allergic rhinitis: Secondary | ICD-10-CM

## 2023-05-12 DIAGNOSIS — F419 Anxiety disorder, unspecified: Secondary | ICD-10-CM

## 2023-05-12 DIAGNOSIS — Z1231 Encounter for screening mammogram for malignant neoplasm of breast: Secondary | ICD-10-CM

## 2023-05-12 NOTE — Progress Notes (Signed)
[facility]upational [facility]- [facility]  Subjective:  Patient ID: Susan Wheeler, female    DOB: 1976/04/14  Age: 47 y.o. MRN: [id number]  CC: wellness exam  HPI LOLISA MUSCH presents for wellness exam visit for insurance benefit.  Patient has a PCP: No PCP, has appointment to establish care this month.  PMH significant for: Migraine and anxiety  Last labs per PCP were completed: 2022  Health Maintenance:  Colonoscopy:No family hx of colon cancer Mammogram: June 2023, due for this.  PAP- partial hysterectomy, unsure of last pap     Smoker: Never  Immunizations:  Tdap- 2023  Lifestyle: Diet- no particular diet.  Exercise- no exercise.    Anxiety: has had some relief of anxiety symptoms with hydroxyzine .   Headaches are about the same, She has been struggling with allergy symptoms now and feels that this may be exacerbating symptoms. She states it starts out with sinus symptoms which then eventually leads with a headache. Recently moved to a new home and suspects something in the home is worsening allergies.   Past Medical History:  Diagnosis Date   Anxiety    BV (bacterial vaginosis) 04/25/2015   Dyspareunia 04/25/2015   GERD (gastroesophageal reflux disease)    Vaginal discharge 04/25/2015   Vaginal itching 04/25/2015    Past Surgical History:  Procedure Laterality Date   CHOLECYSTECTOMY N/A 11/06/2014   Procedure: LAPAROSCOPIC CHOLECYSTECTOMY;  Surgeon: Dalia Heading, MD;  Location: [facility];  Service: General;  Laterality: N/A;   KNEE ARTHROSCOPY Left    MANDIBLE SURGERY     TUBAL LIGATION     VAGINAL HYSTERECTOMY N/A 08/15/2015   Procedure: HYSTERECTOMY VAGINAL;  Surgeon: Lazaro Arms, MD;  Location: [facility];  Service: Gynecology;  Laterality: N/A;    Outpatient Medications Prior to Visit  Medication Sig Dispense Refill   acetaminophen (TYLENOL) 500 MG tablet Take 1,000 mg by mouth every 6 (six) hours as needed.     albuterol (VENTOLIN HFA) 108 (90 Base) MCG/ACT  inhaler Inhale 2 puffs into the lungs every 6 (six) hours as needed for wheezing or shortness of breath. 8 g 1   famotidine (PEPCID) 20 MG tablet Take 20 mg by mouth 2 (two) times daily.     hydrOXYzine (VISTARIL) 25 MG capsule Take 1-2 capsules up to 4 times a day as needed. 30 capsule 0   ibuprofen (ADVIL) 600 MG tablet Take 600 mg by mouth every 6 (six) hours as needed.     Multiple Vitamin (MULTIVITAMIN ADULT PO) Take 1 tablet by mouth daily.     pantoprazole (PROTONIX) 40 MG tablet Take 40 mg by mouth 2 (two) times daily.     SUMAtriptan (IMITREX) 50 MG tablet SMARTSIG:1 Tablet(s) By Mouth 1-2 Times Daily 10 tablet 0   No facility-administered medications prior to visit.    ROS Review of Systems  HENT:  Positive for sinus pressure.   Respiratory:  Negative for shortness of breath and wheezing.   Cardiovascular:  Negative for chest pain.  Gastrointestinal:  Negative for constipation and diarrhea.  Neurological:  Positive for dizziness (sometimes with headaches.) and headaches.  Psychiatric/Behavioral:  The patient is nervous/anxious (improved since last visit.).     Objective:  BP 118/84   Pulse 68   Ht 5\' 3"  (1.6 m)   Wt 188 lb (85.3 kg)   LMP 08/06/2015 (Approximate) Comment: hysterectomy  SpO2 99%   BMI 33.30 kg/m   Physical Exam Constitutional:      General: She  is not in acute distress. HENT:     Head: Normocephalic.  Eyes:     Pupils: Pupils are equal, round, and reactive to light.  Cardiovascular:     Rate and Rhythm: Normal rate and regular rhythm.     Heart sounds: Normal heart sounds.  Pulmonary:     Effort: Pulmonary effort is normal.     Breath sounds: Normal breath sounds.  Abdominal:     General: Bowel sounds are normal.     Palpations: Abdomen is soft.  Musculoskeletal:        General: Normal range of motion.     Right lower leg: No edema.     Left lower leg: No edema.  Skin:    General: Skin is warm.  Neurological:     General: No focal  deficit present.     Mental Status: She is alert and oriented to person, place, and time.  Psychiatric:        Mood and Affect: Mood normal.        Behavior: Behavior normal.      Assessment & Plan:    Lundin was seen today for wellness exam.  Diagnoses and all orders for this visit:  Participant in health and wellness plan Adult wellness physical was conducted today. Importance of diet and exercise were discussed in detail.  We reviewed immunizations and gave recommendations regarding current immunization needed for age.  Preventative health exams needed: Mammogram, ordered placed. Attempted to obtain labs but unable to obtain blood specimen. Discussed that she can wait for her PCP appointment for labs or can consider [facility] to [facility] specimen. Patient plans to wait for PCP appointment but will let me know if anything changes,   Patient was advised yearly wellness exam and follow-up with PCP as scheduled.   Encounter for screening mammogram for breast cancer -     MM 3D SCREENING MAMMOGRAM BILATERAL BREAST; Future  Anxiety Continue hydroxyzine as needed.  Environmental and seasonal allergies Recommended using flonase to aid with symptoms.     Orders Placed This Encounter  Procedures   MM 3D SCREENING MAMMOGRAM BILATERAL BREAST    No orders of the defined types were placed in this encounter.   Follow-up: New PCP appointment July 30th. Follow-up as needed.

## 2023-06-02 ENCOUNTER — Encounter: Payer: Self-pay | Admitting: Family Medicine

## 2023-06-02 ENCOUNTER — Ambulatory Visit (INDEPENDENT_AMBULATORY_CARE_PROVIDER_SITE_OTHER): Payer: PRIVATE HEALTH INSURANCE | Admitting: Family Medicine

## 2023-06-02 VITALS — BP 113/79 | HR 76 | Ht 63.0 in | Wt 193.0 lb

## 2023-06-02 DIAGNOSIS — Z131 Encounter for screening for diabetes mellitus: Secondary | ICD-10-CM

## 2023-06-02 DIAGNOSIS — Z1329 Encounter for screening for other suspected endocrine disorder: Secondary | ICD-10-CM

## 2023-06-02 DIAGNOSIS — G43909 Migraine, unspecified, not intractable, without status migrainosus: Secondary | ICD-10-CM | POA: Insufficient documentation

## 2023-06-02 DIAGNOSIS — G43709 Chronic migraine without aura, not intractable, without status migrainosus: Secondary | ICD-10-CM | POA: Diagnosis not present

## 2023-06-02 DIAGNOSIS — Z8709 Personal history of other diseases of the respiratory system: Secondary | ICD-10-CM

## 2023-06-02 DIAGNOSIS — N951 Menopausal and female climacteric states: Secondary | ICD-10-CM

## 2023-06-02 DIAGNOSIS — J4520 Mild intermittent asthma, uncomplicated: Secondary | ICD-10-CM

## 2023-06-02 DIAGNOSIS — Z13220 Encounter for screening for lipoid disorders: Secondary | ICD-10-CM

## 2023-06-02 DIAGNOSIS — E559 Vitamin D deficiency, unspecified: Secondary | ICD-10-CM

## 2023-06-02 DIAGNOSIS — F419 Anxiety disorder, unspecified: Secondary | ICD-10-CM | POA: Diagnosis not present

## 2023-06-02 DIAGNOSIS — J4540 Moderate persistent asthma, uncomplicated: Secondary | ICD-10-CM

## 2023-06-02 DIAGNOSIS — Z1159 Encounter for screening for other viral diseases: Secondary | ICD-10-CM

## 2023-06-02 MED ORDER — NURTEC 75 MG PO TBDP: 1.0000 | ORAL_TABLET | Freq: Once | ORAL | 1 refills | Status: DC | PRN

## 2023-06-02 MED ORDER — PANTOPRAZOLE SODIUM 40 MG PO TBEC: 40.0000 mg | DELAYED_RELEASE_TABLET | Freq: Two times a day (BID) | ORAL | 3 refills | Status: DC

## 2023-06-02 MED ORDER — BUDESONIDE-FORMOTEROL FUMARATE 80-4.5 MCG/ACT IN AERO: 2.0000 | INHALATION_SPRAY | Freq: Two times a day (BID) | RESPIRATORY_TRACT | 3 refills | Status: DC

## 2023-06-02 MED ORDER — ALBUTEROL SULFATE HFA 108 (90 BASE) MCG/ACT IN AERS: 2.0000 | INHALATION_SPRAY | Freq: Four times a day (QID) | RESPIRATORY_TRACT | 2 refills | Status: DC | PRN

## 2023-06-02 MED ORDER — PAROXETINE HCL 10 MG PO TABS: 10.0000 mg | ORAL_TABLET | Freq: Every evening | ORAL | 1 refills | Status: DC

## 2023-06-02 NOTE — Assessment & Plan Note (Addendum)
    06/02/2023    8:14 AM  GAD 7 : Generalized Anxiety Score  Nervous, Anxious, on Edge 2  Control/stop worrying 1  Worry too much - different things 1  Trouble relaxing 1  Restless 0  Easily annoyed or irritable 1  Afraid - awful might happen 2  Total GAD 7 Score 8  Anxiety Difficulty Somewhat difficult   Trial on Paxil 10 mg at bedtime Follow up in 6 weeks Discussed about cognitive behavioral therapy focusing on thoughts, belief, and attitudes that affects feelings and behavior, learning about coping skills to deal with certain problems. Maintaining a consistent routine and schedule, Practice stress management and self calming techniques, excersise regularly and spend time outdoors, Do not eat food that are high in fat, added sugar, or salt.

## 2023-06-02 NOTE — Patient Instructions (Signed)

## 2023-06-02 NOTE — Assessment & Plan Note (Signed)
Started maintenance program Symbicort inhaler 2 puffs twice daily, Explained to patient it's important to use maintenance inhaler even when you have no symptoms. Albuterol inhaler PRN use only. Encouraged non pharmacological interventions such as avoiding allergens, having SABA inhaler handy at all times. Asthma action plan reviewed and updated. Discussed signs and symptoms of respiratory distress and need to present to the ED. Patient/parent verbalizes understanding regarding plan of care and all questions answered.

## 2023-06-02 NOTE — Progress Notes (Signed)
New Patient Office Visit   Subjective   Patient ID: Susan Wheeler, female    DOB: 26-Nov-1975  Age: 47 y.o. MRN: [id number]  CC:  Chief Complaint  Patient presents with   Establish Care    Patient is here to establish care. Wants to discuss migraines, and possible perimenopause.     HPI Susan Wheeler 47 year old female, presents to establish care. She  has a past medical history of Anxiety, Asthma (01/23/2022), BV (bacterial vaginosis) (04/25/2015), Depressive disorder (01/23/2022), Dyspareunia (04/25/2015), GERD (gastroesophageal reflux disease), Vaginal discharge (04/25/2015), and Vaginal itching (04/25/2015).  Migraine  This is a recurrent problem. Anxiety occurs intermittently and has been gradually worsening. The pain is located in the right unilateral region. The pain radiates to the left neck and right neck. The quality of the pain is described as pulsating, throbbing, squeezing and stabbing. The pain is at a severity of 10/10. Associated symptoms include dizziness, photophobia and a visual change. Pertinent negatives include no abdominal pain, blurred vision, fever or loss of balance. The symptoms are aggravated by noise, bright light and fatigue. She has tried triptans for the symptoms. The treatment provided no relief. Her past medical history is significant for migraine headaches and migraines in the family.   Anxiety Presents for initial visit. Anxiety has been gradually worsening. Symptoms include decreased concentration, dizziness, excessive worry, irritability, muscle tension and nervous/anxious behavior. Patient reports no chest pain or shortness of breath. Symptoms occur occasionally. The severity of symptoms is interfering with daily activities. The symptoms are aggravated by family issues and work stress. The patient sleeps 5 hours per night. The quality of sleep is poor. Nighttime awakenings: several. Risk factors include family history and prior traumatic experience. Her  past medical history is significant for anxiety/panic attacks and depression. Treatments tried: Hydroxzine. The treatment provided no relief. Compliance with prior treatments has been good.        Outpatient Encounter Medications as of 06/02/2023  Medication Sig   acetaminophen (TYLENOL) 500 MG tablet Take 1,000 mg by mouth every 6 (six) hours as needed.   albuterol (VENTOLIN HFA) 108 (90 Base) MCG/ACT inhaler Inhale 2 puffs into the lungs every 6 (six) hours as needed for wheezing or shortness of breath.   budesonide-formoterol (SYMBICORT) 80-4.5 MCG/ACT inhaler Inhale 2 puffs into the lungs 2 (two) times daily.   famotidine (PEPCID) 20 MG tablet Take 20 mg by mouth 2 (two) times daily.   Multiple Vitamin (MULTIVITAMIN ADULT PO) Take 1 tablet by mouth daily.   PARoxetine (PAXIL) 10 MG tablet Take 1 tablet (10 mg total) by mouth at bedtime.   Rimegepant Sulfate (NURTEC) 75 MG TBDP Take 1 tablet (75 mg total) by mouth once as needed for up to 1 dose.   [DISCONTINUED] albuterol (VENTOLIN HFA) 108 (90 Base) MCG/ACT inhaler Inhale 2 puffs into the lungs every 6 (six) hours as needed for wheezing or shortness of breath.   [DISCONTINUED] ibuprofen (ADVIL) 600 MG tablet Take 600 mg by mouth every 6 (six) hours as needed.   [DISCONTINUED] pantoprazole (PROTONIX) 40 MG tablet Take 40 mg by mouth 2 (two) times daily.   [DISCONTINUED] SUMAtriptan (IMITREX) 50 MG tablet SMARTSIG:1 Tablet(s) By Mouth 1-2 Times Daily   hydrOXYzine (VISTARIL) 25 MG capsule Take 1-2 capsules up to 4 times a day as needed. (Patient not taking: Reported on 06/02/2023)   pantoprazole (PROTONIX) 40 MG tablet Take 1 tablet (40 mg total) by mouth 2 (two) times daily.   No  facility-administered encounter medications on file as of 06/02/2023.    Past Surgical History:  Procedure Laterality Date   CHOLECYSTECTOMY N/A 11/06/2014   Procedure: LAPAROSCOPIC CHOLECYSTECTOMY;  Surgeon: Dalia Heading, MD;  Location: [facility];  Service:  General;  Laterality: N/A;   KNEE ARTHROSCOPY Left    MANDIBLE SURGERY     TUBAL LIGATION     VAGINAL HYSTERECTOMY N/A 08/15/2015   Procedure: HYSTERECTOMY VAGINAL;  Surgeon: Lazaro Arms, MD;  Location: [facility];  Service: Gynecology;  Laterality: N/A;    Review of Systems  Constitutional:  Positive for diaphoresis. Negative for chills and fever.  Respiratory:  Negative for shortness of breath.   Cardiovascular:  Negative for chest pain.  Gastrointestinal:  Negative for abdominal pain.  Genitourinary:  Negative for dysuria.  Musculoskeletal:  Negative for myalgias.  Neurological:  Positive for headaches.  Psychiatric/Behavioral:  The patient is nervous/anxious.       Objective    BP 113/79   Pulse 76   Ht 5\' 3"  (1.6 m)   Wt 193 lb (87.5 kg)   LMP 08/06/2015 (Approximate) Comment: hysterectomy  SpO2 98%   BMI 34.19 kg/m   Physical Exam Vitals reviewed.  Constitutional:      General: She is not in acute distress.    Appearance: Normal appearance. She is not ill-appearing, toxic-appearing or diaphoretic.  HENT:     Head: Normocephalic.     Right Ear: Tympanic membrane normal.     Left Ear: Tympanic membrane normal.     Nose: Nose normal.     Mouth/Throat:     Mouth: Mucous membranes are moist.  Eyes:     General:        Right eye: No discharge.        Left eye: No discharge.     Conjunctiva/sclera: Conjunctivae normal.     Pupils: Pupils are equal, round, and reactive to light.  Cardiovascular:     Pulses: Normal pulses.     Heart sounds: Normal heart sounds.  Pulmonary:     Effort: Pulmonary effort is normal. No respiratory distress.     Breath sounds: Normal breath sounds.  Abdominal:     General: Bowel sounds are normal.     Palpations: Abdomen is soft.     Tenderness: There is no abdominal tenderness. There is no right CVA tenderness, left CVA tenderness or guarding.  Musculoskeletal:        General: Normal range of motion.     Cervical back: Normal range  of motion.  Skin:    General: Skin is warm and dry.     Capillary Refill: Capillary refill takes less than 2 seconds.  Neurological:     General: No focal deficit present.     Mental Status: She is alert and oriented to person, place, and time.     Coordination: Coordination normal.     Gait: Gait normal.  Psychiatric:        Mood and Affect: Mood normal.        Behavior: Behavior normal.       Assessment & Plan:  Screening for diabetes mellitus -     Hemoglobin A1c -     Microalbumin / creatinine urine ratio  Screening for lipid disorders -     BMP8+EGFR -     CBC with Differential/Platelet -     Lipid panel  Screening for thyroid disorder -     TSH + free T4  Need for hepatitis C  screening test -     Hepatitis C antibody  Vitamin D deficiency -     VITAMIN D 25 Hydroxy (Vit-D Deficiency, Fractures)  Chronic migraine without aura without status migrainosus, not intractable Assessment & Plan: Trial on Nurtec 75 mg PRN Advise methods include deep breathing, cognitive behavioral therapy focusing on changing thoughts, Limit or avoid alcohol, caffeine, chocolate, canner foods, MSG and aspartame.  Implement sleep hygiene includes not watching TV or listen music in bed, don't eat heavy meals within a couple of hours of bedtime, don't use your phone, laptop, or tablet at bedtime     Anxiety Assessment & Plan:    06/02/2023    8:14 AM  GAD 7 : Generalized Anxiety Score  Nervous, Anxious, on Edge 2  Control/stop worrying 1  Worry too much - different things 1  Trouble relaxing 1  Restless 0  Easily annoyed or irritable 1  Afraid - awful might happen 2  Total GAD 7 Score 8  Anxiety Difficulty Somewhat difficult   Trial on Paxil 10 mg at bedtime Follow up in 6 weeks Discussed about cognitive behavioral therapy focusing on thoughts, belief, and attitudes that affects feelings and behavior, learning about coping skills to deal with certain problems. Maintaining a  consistent routine and schedule, Practice stress management and self calming techniques, excersise regularly and spend time outdoors, Do not eat food that are high in fat, added sugar, or salt.    Hot flashes due to menopause -     Ambulatory referral to Obstetrics / Gynecology  Asthma in adult, mild intermittent, uncomplicated  History of asthma -     Albuterol Sulfate HFA; Inhale 2 puffs into the lungs every 6 (six) hours as needed for wheezing or shortness of breath.  Dispense: 8 g; Refill: 2 -     Budesonide-Formoterol Fumarate; Inhale 2 puffs into the lungs 2 (two) times daily.  Dispense: 1 each; Refill: 3  Moderate persistent asthma without complication Assessment & Plan: Started maintenance program Symbicort inhaler 2 puffs twice daily, Explained to patient it's important to use maintenance inhaler even when you have no symptoms. Albuterol inhaler PRN use only. Encouraged non pharmacological interventions such as avoiding allergens, having SABA inhaler handy at all times. Asthma action plan reviewed and updated. Discussed signs and symptoms of respiratory distress and need to present to the ED. Patient/parent verbalizes understanding regarding plan of care and all questions answered.    Other orders -     Nurtec; Take 1 tablet (75 mg total) by mouth once as needed for up to 1 dose.  Dispense: 8 tablet; Refill: 1 -     PARoxetine HCl; Take 1 tablet (10 mg total) by mouth at bedtime.  Dispense: 30 tablet; Refill: 1 -     Pantoprazole Sodium; Take 1 tablet (40 mg total) by mouth 2 (two) times daily.  Dispense: 30 tablet; Refill: 3    Return in about 8 weeks (around 07/28/2023), or if symptoms worsen or fail to improve, for Anxiety, Migraine.   Cruzita Lederer Newman Nip, FNP

## 2023-06-02 NOTE — Assessment & Plan Note (Addendum)
Trial on Nurtec 75 mg PRN Advise methods include deep breathing, cognitive behavioral therapy focusing on changing thoughts, Limit or avoid alcohol, caffeine, chocolate, canner foods, MSG and aspartame.  Implement sleep hygiene includes not watching TV or listen music in bed, don't eat heavy meals within a couple of hours of bedtime, don't use your phone, laptop, or tablet at bedtime

## 2023-06-26 ENCOUNTER — Encounter: Payer: Self-pay | Admitting: Adult Health

## 2023-07-28 ENCOUNTER — Ambulatory Visit: Payer: No Typology Code available for payment source | Admitting: Obstetrics & Gynecology

## 2023-07-28 ENCOUNTER — Ambulatory Visit: Payer: No Typology Code available for payment source | Admitting: Family Medicine

## 2023-07-28 ENCOUNTER — Encounter: Payer: Self-pay | Admitting: Family Medicine

## 2023-07-28 ENCOUNTER — Encounter: Payer: Self-pay | Admitting: Obstetrics & Gynecology

## 2023-07-28 VITALS — BP 127/88 | HR 84 | Ht 64.0 in | Wt 199.0 lb

## 2023-07-28 VITALS — BP 129/78 | HR 79 | Ht 64.0 in | Wt 198.1 lb

## 2023-07-28 DIAGNOSIS — J4520 Mild intermittent asthma, uncomplicated: Secondary | ICD-10-CM | POA: Diagnosis not present

## 2023-07-28 DIAGNOSIS — N941 Unspecified dyspareunia: Secondary | ICD-10-CM | POA: Diagnosis not present

## 2023-07-28 DIAGNOSIS — K5900 Constipation, unspecified: Secondary | ICD-10-CM

## 2023-07-28 DIAGNOSIS — K649 Unspecified hemorrhoids: Secondary | ICD-10-CM | POA: Diagnosis not present

## 2023-07-28 DIAGNOSIS — N951 Menopausal and female climacteric states: Secondary | ICD-10-CM | POA: Diagnosis not present

## 2023-07-28 DIAGNOSIS — F419 Anxiety disorder, unspecified: Secondary | ICD-10-CM | POA: Diagnosis not present

## 2023-07-28 DIAGNOSIS — G43709 Chronic migraine without aura, not intractable, without status migrainosus: Secondary | ICD-10-CM

## 2023-07-28 MED ORDER — HYDROCORTISONE ACETATE 25 MG RE SUPP: 25.0000 mg | Freq: Two times a day (BID) | RECTAL | 0 refills | Status: DC

## 2023-07-28 MED ORDER — ESTRADIOL 0.1 MG/24HR TD PTTW: 1.0000 | MEDICATED_PATCH | TRANSDERMAL | 12 refills | Status: DC

## 2023-07-28 MED ORDER — ESTRADIOL 0.1 MG/GM VA CREA: TOPICAL_CREAM | VAGINAL | 12 refills | Status: DC

## 2023-07-28 MED ORDER — PAROXETINE HCL 30 MG PO TABS: 30.0000 mg | ORAL_TABLET | Freq: Every day | ORAL | 3 refills | Status: DC

## 2023-07-28 MED ORDER — PROPRANOLOL HCL 10 MG PO TABS: 10.0000 mg | ORAL_TABLET | Freq: Two times a day (BID) | ORAL | 3 refills | Status: DC | PRN

## 2023-07-28 MED ORDER — MONTELUKAST SODIUM 10 MG PO TABS: 10.0000 mg | ORAL_TABLET | Freq: Every day | ORAL | 3 refills | Status: DC

## 2023-07-28 MED ORDER — DOCUSATE SODIUM 100 MG PO CAPS: 100.0000 mg | ORAL_CAPSULE | Freq: Every day | ORAL | 2 refills | Status: DC

## 2023-07-28 MED ORDER — HYDROXYZINE PAMOATE 50 MG PO CAPS: 50.0000 mg | ORAL_CAPSULE | Freq: Every evening | ORAL | 2 refills | Status: DC | PRN

## 2023-07-28 MED ORDER — FLUTICASONE-SALMETEROL 45-21 MCG/ACT IN AERO: 2.0000 | INHALATION_SPRAY | Freq: Two times a day (BID) | RESPIRATORY_TRACT | 12 refills | Status: DC

## 2023-07-28 NOTE — Assessment & Plan Note (Addendum)
Propanolol 10 mg PRN Discussed Regular Sleep Patterns: Aim for consistent sleep habits, going to bed and waking up at the same time each day. Hydration: Dehydration can trigger migraines. Drink plenty of water throughout the day. Diet and Nutrition: Identify and avoid food triggers such as caffeine, alcohol, processed foods, artificial sweeteners, and certain cheeses. Keeping a food diary can help track triggers.

## 2023-07-28 NOTE — Progress Notes (Signed)
Chief Complaint  Patient presents with   Hot Flashes    Off and on for a while, worse in the last 6 months.       47 y.o. G3P3 Patient's last menstrual period was 08/06/2015 (approximate). The current method of family planning is status post hysterectomy.  Outpatient Encounter Medications as of 07/28/2023  Medication Sig   acetaminophen (TYLENOL) 500 MG tablet Take 1,000 mg by mouth every 6 (six) hours as needed.   albuterol (VENTOLIN HFA) 108 (90 Base) MCG/ACT inhaler Inhale 2 puffs into the lungs every 6 (six) hours as needed for wheezing or shortness of breath.   docusate sodium (COLACE) 100 MG capsule Take 1 capsule (100 mg total) by mouth daily.   estradiol (ESTRACE) 0.1 MG/GM vaginal cream 1 gram at bedtime   [START ON 07/30/2023] estradiol (VIVELLE-DOT) 0.1 MG/24HR patch Place 1 patch (0.1 mg total) onto the skin 2 (two) times a week.   famotidine (PEPCID) 20 MG tablet Take 20 mg by mouth 2 (two) times daily.   fluticasone-salmeterol (ADVAIR HFA) 45-21 MCG/ACT inhaler Inhale 2 puffs into the lungs 2 (two) times daily.   hydrocortisone (ANUSOL-HC) 25 MG suppository Place 1 suppository (25 mg total) rectally 2 (two) times daily.   hydrOXYzine (VISTARIL) 50 MG capsule Take 1 capsule (50 mg total) by mouth at bedtime as needed.   montelukast (SINGULAIR) 10 MG tablet Take 1 tablet (10 mg total) by mouth at bedtime.   Multiple Vitamin (MULTIVITAMIN ADULT PO) Take 1 tablet by mouth daily.   pantoprazole (PROTONIX) 40 MG tablet Take 1 tablet (40 mg total) by mouth 2 (two) times daily.   PARoxetine (PAXIL) 30 MG tablet Take 1 tablet (30 mg total) by mouth daily.   propranolol (INDERAL) 10 MG tablet Take 1 tablet (10 mg total) by mouth every 12 (twelve) hours as needed.   Rimegepant Sulfate (NURTEC) 75 MG TBDP Take 1 tablet (75 mg total) by mouth once as needed for up to 1 dose.   No facility-administered encounter medications on file as of 07/28/2023.    Subjective Pt has been  having ^VMS for the past 6 months as well as ^vaginal dryness and discomfort with intercourse TV[facility] 2016  Past Medical History:  Diagnosis Date   Anxiety    Asthma 01/23/2022   BV (bacterial vaginosis) 04/25/2015   Depressive disorder 01/23/2022   Dyspareunia 04/25/2015   GERD (gastroesophageal reflux disease)    Vaginal discharge 04/25/2015   Vaginal itching 04/25/2015    Past Surgical History:  Procedure Laterality Date   CHOLECYSTECTOMY N/A 11/06/2014   Procedure: LAPAROSCOPIC CHOLECYSTECTOMY;  Surgeon: Dalia Heading, MD;  Location: [facility];  Service: General;  Laterality: N/A;   KNEE ARTHROSCOPY Left    MANDIBLE SURGERY     TUBAL LIGATION     VAGINAL HYSTERECTOMY N/A 08/15/2015   Procedure: HYSTERECTOMY VAGINAL;  Surgeon: Lazaro Arms, MD;  Location: [facility];  Service: Gynecology;  Laterality: N/A;    OB History     Gravida  3   Para  3   Term      Preterm      AB      Living  3      SAB      IAB      Ectopic      Multiple      Live Births              Allergies  Allergen Reactions  Codeine Hives   Dilaudid [Hydromorphone] Nausea And Vomiting   Penicillins Hives and Nausea And Vomiting    Has patient had a PCN reaction causing immediate rash, facial/tongue/throat swelling, SOB or lightheadedness with hypotension: No Has patient had a PCN reaction causing severe rash involving mucus membranes or skin necrosis: No Has patient had a PCN reaction that required hospitalization No Has patient had a PCN reaction occurring within the last 10 years: No If all of the above answers are "NO", then may proceed with Cephalosporin use.     Social History   Socioeconomic History   Marital status: Married    Spouse name: Not on file   Number of children: Not on file   Years of education: Not on file   Highest education level: Not on file  Occupational History   Not on file  Tobacco Use   Smoking status: Never   Smokeless tobacco: Never  Vaping Use    Vaping status: Never Used  Substance and Sexual Activity   Alcohol use: No   Drug use: No   Sexual activity: Not Currently    Birth control/protection: Surgical    Comment: tubal  Other Topics Concern   Not on file  Social History Narrative   Not on file   Social Determinants of Health   Financial Resource Strain: Not on file  Food Insecurity: Not on file  Transportation Needs: Not on file  Physical Activity: Not on file  Stress: Not on file  Social Connections: Not on file    Family History  Problem Relation Age of Onset   Migraines Mother    Asthma Daughter    Asthma Son    Cancer Maternal Grandmother    COPD Maternal Grandfather    Asthma Son     Medications:       Current Outpatient Medications:    acetaminophen (TYLENOL) 500 MG tablet, Take 1,000 mg by mouth every 6 (six) hours as needed., Disp: , Rfl:    albuterol (VENTOLIN HFA) 108 (90 Base) MCG/ACT inhaler, Inhale 2 puffs into the lungs every 6 (six) hours as needed for wheezing or shortness of breath., Disp: 8 g, Rfl: 2   docusate sodium (COLACE) 100 MG capsule, Take 1 capsule (100 mg total) by mouth daily., Disp: 30 capsule, Rfl: 2   estradiol (ESTRACE) 0.1 MG/GM vaginal cream, 1 gram at bedtime, Disp: 30 g, Rfl: 12   [START ON 07/30/2023] estradiol (VIVELLE-DOT) 0.1 MG/24HR patch, Place 1 patch (0.1 mg total) onto the skin 2 (two) times a week., Disp: 8 patch, Rfl: 12   famotidine (PEPCID) 20 MG tablet, Take 20 mg by mouth 2 (two) times daily., Disp: , Rfl:    fluticasone-salmeterol (ADVAIR HFA) 45-21 MCG/ACT inhaler, Inhale 2 puffs into the lungs 2 (two) times daily., Disp: 1 each, Rfl: 12   hydrocortisone (ANUSOL-HC) 25 MG suppository, Place 1 suppository (25 mg total) rectally 2 (two) times daily., Disp: 12 suppository, Rfl: 0   hydrOXYzine (VISTARIL) 50 MG capsule, Take 1 capsule (50 mg total) by mouth at bedtime as needed., Disp: 30 capsule, Rfl: 2   montelukast (SINGULAIR) 10 MG tablet, Take 1 tablet (10 mg  total) by mouth at bedtime., Disp: 30 tablet, Rfl: 3   Multiple Vitamin (MULTIVITAMIN ADULT PO), Take 1 tablet by mouth daily., Disp: , Rfl:    pantoprazole (PROTONIX) 40 MG tablet, Take 1 tablet (40 mg total) by mouth 2 (two) times daily., Disp: 30 tablet, Rfl: 3   PARoxetine (PAXIL) 30  MG tablet, Take 1 tablet (30 mg total) by mouth daily., Disp: 30 tablet, Rfl: 3   propranolol (INDERAL) 10 MG tablet, Take 1 tablet (10 mg total) by mouth every 12 (twelve) hours as needed., Disp: 30 tablet, Rfl: 3   Rimegepant Sulfate (NURTEC) 75 MG TBDP, Take 1 tablet (75 mg total) by mouth once as needed for up to 1 dose., Disp: 8 tablet, Rfl: 1  Objective Blood pressure 127/88, pulse 84, height 5\' 4"  (1.626 m), weight 199 lb (90.3 kg), last menstrual period 08/06/2015.  General WDWN female NAD Vulva:  normal appearing vulva with no masses, tenderness or lesions Vagina:  normal mucosa, no discharge Cervix:  surgically absent Uterus:  surgically absent Adnexa: ovaries:present,  normal adnexa in size, nontender and no masses   Pertinent ROS No burning with urination, frequency or urgency No nausea, vomiting or diarrhea Nor fever chills or other constitutional symptoms   Labs or studies No new    Impression + Management Plan: Diagnoses this Encounter::   ICD-10-CM   1. Vasomotor symptoms due to menopause  N95.1    vivelle dot 0.1 mg twice weekly    2. Dyspareunia in female: due to menopausal vaginal dryness  N94.10    vaginal estrogen 1 gram qohs        Medications prescribed during  this encounter: Meds ordered this encounter  Medications   estradiol (VIVELLE-DOT) 0.1 MG/24HR patch    Sig: Place 1 patch (0.1 mg total) onto the skin 2 (two) times a week.    Dispense:  8 patch    Refill:  12   estradiol (ESTRACE) 0.1 MG/GM vaginal cream    Sig: 1 gram at bedtime    Dispense:  30 g    Refill:  12    Labs or Scans Ordered during this encounter: No orders of the defined types were  placed in this encounter.     Follow up Return in about 3 months (around 10/27/2023) for Follow up, with Dr Despina Hidden.

## 2023-07-28 NOTE — Progress Notes (Signed)
Patient Office Visit   Subjective   Patient ID: Susan Wheeler, female    DOB: [birth year]  Age: 47 y.o. MRN: [id number]  CC:  Chief Complaint  Patient presents with   Care Management    Follow up reports cough still present. Also has hemorrhoids.    Anxiety    Follow up    HPI Susan Wheeler 47 year old female, presents to he clinic for worsening constipation,anxiety follow up. She  has a past medical history of Anxiety, Asthma (01/23/2022), BV (bacterial vaginosis) (04/25/2015), Depressive disorder (01/23/2022), Dyspareunia (04/25/2015), GERD (gastroesophageal reflux disease), Vaginal discharge (04/25/2015), and Vaginal itching (04/25/2015).  Constipation: Patient complains of constipation.  Stool pattern has been 1-2  firm and formed stool per day, sometime every 3 days. Onset was a few months ago defecation has been painful with occasional rectal bleeing. Co-Morbid conditions:obesity, not enough water intake and stress. Symptoms have been gradually worsening. Current Health Habits: Eating fiber? no Exercise?no Water intake? 4-5 bottles of water a day Current OTC/RX therapy has been takes probiotic three times a week  which has been ineffective.        Outpatient Encounter Medications as of 07/28/2023  Medication Sig   acetaminophen (TYLENOL) 500 MG tablet Take 1,000 mg by mouth every 6 (six) hours as needed.   albuterol (VENTOLIN HFA) 108 (90 Base) MCG/ACT inhaler Inhale 2 puffs into the lungs every 6 (six) hours as needed for wheezing or shortness of breath.   docusate sodium (COLACE) 100 MG capsule Take 1 capsule (100 mg total) by mouth daily.   famotidine (PEPCID) 20 MG tablet Take 20 mg by mouth 2 (two) times daily.   fluticasone-salmeterol (ADVAIR HFA) 45-21 MCG/ACT inhaler Inhale 2 puffs into the lungs 2 (two) times daily.   hydrocortisone (ANUSOL-HC) 25 MG suppository Place 1 suppository (25 mg total) rectally 2 (two) times daily.   hydrOXYzine (VISTARIL) 50 MG capsule  Take 1 capsule (50 mg total) by mouth at bedtime as needed.   montelukast (SINGULAIR) 10 MG tablet Take 1 tablet (10 mg total) by mouth at bedtime.   Multiple Vitamin (MULTIVITAMIN ADULT PO) Take 1 tablet by mouth daily.   pantoprazole (PROTONIX) 40 MG tablet Take 1 tablet (40 mg total) by mouth 2 (two) times daily.   PARoxetine (PAXIL) 30 MG tablet Take 1 tablet (30 mg total) by mouth daily.   propranolol (INDERAL) 10 MG tablet Take 1 tablet (10 mg total) by mouth every 12 (twelve) hours as needed.   Rimegepant Sulfate (NURTEC) 75 MG TBDP Take 1 tablet (75 mg total) by mouth once as needed for up to 1 dose.   [DISCONTINUED] budesonide-formoterol (SYMBICORT) 80-4.5 MCG/ACT inhaler Inhale 2 puffs into the lungs 2 (two) times daily.   [DISCONTINUED] hydrOXYzine (VISTARIL) 25 MG capsule Take 1-2 capsules up to 4 times a day as needed.   [DISCONTINUED] PARoxetine (PAXIL) 10 MG tablet Take 1 tablet (10 mg total) by mouth at bedtime.   No facility-administered encounter medications on file as of 07/28/2023.    Past Surgical History:  Procedure Laterality Date   CHOLECYSTECTOMY N/A 11/06/2014   Procedure: LAPAROSCOPIC CHOLECYSTECTOMY;  Surgeon: Dalia Heading, MD;  Location: [facility];  Service: General;  Laterality: N/A;   KNEE ARTHROSCOPY Left    MANDIBLE SURGERY     TUBAL LIGATION     VAGINAL HYSTERECTOMY N/A 08/15/2015   Procedure: HYSTERECTOMY VAGINAL;  Surgeon: Lazaro Arms, MD;  Location: [facility];  Service: Gynecology;  Laterality:  N/A;    Review of Systems  Constitutional:  Negative for chills and fever.  Eyes:  Negative for blurred vision.  Respiratory:  Negative for shortness of breath.   Cardiovascular:  Negative for chest pain.  Gastrointestinal:  Positive for abdominal pain, constipation and hematochezia. Negative for blood in stool, heartburn, melena, nausea and vomiting.  Genitourinary:  Negative for dysuria.  Neurological:  Positive for headaches.  Psychiatric/Behavioral:  The  patient is nervous/anxious and has insomnia.       Objective    BP 129/78   Pulse 79   Ht 5\' 4"  (1.626 m)   Wt 198 lb 1.3 oz (89.8 kg)   LMP 08/06/2015 (Approximate) Comment: hysterectomy  SpO2 96%   BMI 34.00 kg/m   Physical Exam Vitals reviewed.  Constitutional:      General: She is not in acute distress.    Appearance: Normal appearance. She is not ill-appearing, toxic-appearing or diaphoretic.  HENT:     Head: Normocephalic.  Eyes:     General:        Right eye: No discharge.        Left eye: No discharge.     Conjunctiva/sclera: Conjunctivae normal.  Cardiovascular:     Rate and Rhythm: Normal rate.     Pulses: Normal pulses.     Heart sounds: Normal heart sounds.  Pulmonary:     Effort: Pulmonary effort is normal. No respiratory distress.     Breath sounds: Normal breath sounds.  Abdominal:     General: Bowel sounds are normal.     Palpations: Abdomen is soft.     Tenderness: There is no abdominal tenderness. There is no guarding.  Musculoskeletal:        General: Normal range of motion.     Cervical back: Normal range of motion.  Skin:    General: Skin is warm and dry.     Capillary Refill: Capillary refill takes less than 2 seconds.  Neurological:     Mental Status: She is alert.     Coordination: Coordination normal.     Gait: Gait normal.  Psychiatric:        Mood and Affect: Mood normal.        Behavior: Behavior normal.       Assessment & Plan:  Hemorrhoids, unspecified hemorrhoid type -     Ambulatory referral to Gastroenterology  Mild intermittent asthma without complication -     Montelukast Sodium; Take 1 tablet (10 mg total) by mouth at bedtime.  Dispense: 30 tablet; Refill: 3  Anxiety Assessment & Plan: Patient reports anxiety worsening since her friend passing away Increased Paxil 30 mg once daily Follow up in 8 weeks Discussed about cognitive behavioral therapy focusing on thoughts, belief, and attitudes that affects feelings and  behavior, learning about coping skills to deal with certain problems. Maintaining a consistent routine and schedule, Practice stress management and self calming techniques, excersise regularly and spend time outdoors, Do not eat food that are high in fat, added sugar, or salt.    Constipation, unspecified constipation type Assessment & Plan: Advise Probiotic intake daily, Increase Fiber Intake: Eat more fruits, vegetables, whole grains, and legumes. Foods high in fiber help to soften stool and make it easier to pass. Stay Hydrated: Drink plenty of water throughout the day. Aim for at least 8 glasses of water daily. Try to get at least 30 minutes of exercise most days of the week. Activities like walking, jogging, or yoga can help stimulate  bowel movements.  Colace 100 mg once daily. Patient interested in GI referral    Chronic migraine without aura without status migrainosus, not intractable Assessment & Plan: Propanolol 10 mg PRN Discussed Regular Sleep Patterns: Aim for consistent sleep habits, going to bed and waking up at the same time each day. Hydration: Dehydration can trigger migraines. Drink plenty of water throughout the day. Diet and Nutrition: Identify and avoid food triggers such as caffeine, alcohol, processed foods, artificial sweeteners, and certain cheeses. Keeping a food diary can help track triggers.   Other orders -     Docusate Sodium; Take 1 capsule (100 mg total) by mouth daily.  Dispense: 30 capsule; Refill: 2 -     Hydrocortisone Acetate; Place 1 suppository (25 mg total) rectally 2 (two) times daily.  Dispense: 12 suppository; Refill: 0 -     Fluticasone-Salmeterol; Inhale 2 puffs into the lungs 2 (two) times daily.  Dispense: 1 each; Refill: 12 -     PARoxetine HCl; Take 1 tablet (30 mg total) by mouth daily.  Dispense: 30 tablet; Refill: 3 -     Propranolol HCl; Take 1 tablet (10 mg total) by mouth every 12 (twelve) hours as needed.  Dispense: 30 tablet; Refill:  3 -     hydrOXYzine Pamoate; Take 1 capsule (50 mg total) by mouth at bedtime as needed.  Dispense: 30 capsule; Refill: 2    Return in about 8 weeks (around 09/22/2023), or if symptoms worsen or fail to improve, for Anxiety, medication managment.   Cruzita Lederer Newman Nip, FNP

## 2023-07-28 NOTE — Assessment & Plan Note (Signed)
Patient reports anxiety worsening since her friend passing away Increased Paxil 30 mg once daily Follow up in 8 weeks Discussed about cognitive behavioral therapy focusing on thoughts, belief, and attitudes that affects feelings and behavior, learning about coping skills to deal with certain problems. Maintaining a consistent routine and schedule, Practice stress management and self calming techniques, excersise regularly and spend time outdoors, Do not eat food that are high in fat, added sugar, or salt.

## 2023-07-28 NOTE — Assessment & Plan Note (Signed)
Advise Probiotic intake daily, Increase Fiber Intake: Eat more fruits, vegetables, whole grains, and legumes. Foods high in fiber help to soften stool and make it easier to pass. Stay Hydrated: Drink plenty of water throughout the day. Aim for at least 8 glasses of water daily. Try to get at least 30 minutes of exercise most days of the week. Activities like walking, jogging, or yoga can help stimulate bowel movements.  Colace 100 mg once daily. Patient interested in GI referral

## 2023-08-05 ENCOUNTER — Encounter: Payer: Self-pay | Admitting: Family Medicine

## 2023-08-05 ENCOUNTER — Encounter (INDEPENDENT_AMBULATORY_CARE_PROVIDER_SITE_OTHER): Payer: Self-pay

## 2023-08-08 ENCOUNTER — Other Ambulatory Visit: Payer: Self-pay | Admitting: Family Medicine

## 2023-08-18 ENCOUNTER — Ambulatory Visit (INDEPENDENT_AMBULATORY_CARE_PROVIDER_SITE_OTHER): Payer: No Typology Code available for payment source | Admitting: Gastroenterology

## 2023-08-28 ENCOUNTER — Other Ambulatory Visit: Payer: Self-pay | Admitting: Family Medicine

## 2023-08-28 ENCOUNTER — Other Ambulatory Visit: Payer: Self-pay

## 2023-08-28 DIAGNOSIS — K2100 Gastro-esophageal reflux disease with esophagitis, without bleeding: Secondary | ICD-10-CM

## 2023-09-01 NOTE — Telephone Encounter (Signed)
Please fax health records dated on 06/02/23  to insurance with a note stating: patient needs maintenance inhaler "Symbicort" for her asthma   FAX: [phone]

## 2023-09-14 ENCOUNTER — Other Ambulatory Visit: Payer: Self-pay | Admitting: Family Medicine

## 2023-09-14 DIAGNOSIS — K2100 Gastro-esophageal reflux disease with esophagitis, without bleeding: Secondary | ICD-10-CM

## 2023-09-17 ENCOUNTER — Ambulatory Visit (INDEPENDENT_AMBULATORY_CARE_PROVIDER_SITE_OTHER): Payer: No Typology Code available for payment source | Admitting: Gastroenterology

## 2023-09-21 NOTE — Progress Notes (Unsigned)
   Established Patient Office Visit   Subjective  Patient ID: Susan Wheeler, female    DOB: 1975/12/06  Age: 47 y.o. MRN: [id number]  No chief complaint on file.   She  has a past medical history of Anxiety, Asthma (01/23/2022), BV (bacterial vaginosis) (04/25/2015), Depressive disorder (01/23/2022), Dyspareunia (04/25/2015), GERD (gastroesophageal reflux disease), Vaginal discharge (04/25/2015), and Vaginal itching (04/25/2015).  HPI  ROS    Objective:     LMP 08/06/2015 (Approximate) Comment: hysterectomy {Vitals History (Optional):23777}  Physical Exam   No results found for any visits on 09/22/23.  The 10-year ASCVD risk score (Arnett DK, et al., 2019) is: 0.6%    Assessment & Plan:  There are no diagnoses linked to this encounter.  No follow-ups on file.   Cruzita Lederer Newman Nip, FNP

## 2023-09-21 NOTE — Patient Instructions (Signed)
        Great to see you today.  I have refilled the medication(s) we provide.     Current approved weight loss injections include:  Wegovy   Zepbound (tirzepatide)  Saxenda (liraglutide)  ----------------------------------------------------------------------- Phentermine Oral tablets for 3 months only     If labs were collected, we will inform you of lab results once received either by echart message or telephone call.   - echart message- for normal results that have been seen by the patient already.   - telephone call: abnormal results or if patient has not viewed results in their echart.   - Please take medications as prescribed. - Follow up with your primary health provider if any health concerns arises. - If symptoms worsen please contact your primary care provider and/or visit the emergency department.

## 2023-09-22 ENCOUNTER — Ambulatory Visit (INDEPENDENT_AMBULATORY_CARE_PROVIDER_SITE_OTHER): Payer: No Typology Code available for payment source | Admitting: Family Medicine

## 2023-09-22 ENCOUNTER — Encounter: Payer: Self-pay | Admitting: Family Medicine

## 2023-09-22 VITALS — BP 131/89 | HR 76 | Ht 64.0 in | Wt 203.1 lb

## 2023-09-22 DIAGNOSIS — G43709 Chronic migraine without aura, not intractable, without status migrainosus: Secondary | ICD-10-CM

## 2023-09-22 DIAGNOSIS — F419 Anxiety disorder, unspecified: Secondary | ICD-10-CM | POA: Diagnosis not present

## 2023-09-22 DIAGNOSIS — Z1211 Encounter for screening for malignant neoplasm of colon: Secondary | ICD-10-CM | POA: Diagnosis not present

## 2023-09-22 MED ORDER — FLUTICASONE-SALMETEROL 45-21 MCG/ACT IN AERO: 2.0000 | INHALATION_SPRAY | Freq: Two times a day (BID) | RESPIRATORY_TRACT | 12 refills | Status: DC

## 2023-09-22 MED ORDER — BUPROPION HCL ER (XL) 150 MG PO TB24: 150.0000 mg | ORAL_TABLET | Freq: Every day | ORAL | 3 refills | Status: DC

## 2023-09-22 MED ORDER — PAROXETINE HCL 40 MG PO TABS: 40.0000 mg | ORAL_TABLET | Freq: Every day | ORAL | 2 refills | Status: DC

## 2023-09-22 MED ORDER — TOPIRAMATE 25 MG PO TABS: 25.0000 mg | ORAL_TABLET | Freq: Every day | ORAL | 3 refills | Status: DC

## 2023-09-22 NOTE — Assessment & Plan Note (Signed)
Failed on Imitrex, Propranolol PRN Trial Topamax 25 mg once daily Advise Prioritize regular sleep, hydration, and balanced meals to reduce triggers. Practice stress management techniques like mindfulness, yoga, or deep breathing to help prevent episodes. Keep a migraine diary to identify and avoid specific triggers such as bright lights, certain foods, or environmental factors.

## 2023-09-22 NOTE — Assessment & Plan Note (Signed)
    09/22/2023    8:30 AM 07/28/2023    9:35 AM 06/02/2023    8:14 AM  GAD 7 : Generalized Anxiety Score  Nervous, Anxious, on Edge 0 0 2  Control/stop worrying 0 0 1  Worry too much - different things 0 0 1  Trouble relaxing 0 0 1  Restless 0 0 0  Easily annoyed or irritable 0 0 1  Afraid - awful might happen 0 0 2  Total GAD 7 Score 0 0 8  Anxiety Difficulty Not difficult at all Not difficult at all Somewhat difficult     Flowsheet Row Office Visit from 09/22/2023 in [facility]  PHQ-9 Total Score 3       Controlled, Paxil 40 mg, Wellbutrin 150 mg once daily We discussed several non-pharmacological approaches to managing anxiety including:  Establishing a consistent daily routine: This helps create structure and stability. Practicing mindfulness and relaxation techniques: Incorporating meditation, deep breathing exercises, or yoga to manage stress and improve emotional well-being. Engaging in regular physical activity: Aim for at least 30 minutes of exercise most days to boost mood and energy levels. Spending time outdoors: Exposure to natural light and fresh air can improve mental health. Building a support network: Encouraging social connections with friends, family, or support groups to reduce feelings of isolation. Prioritizing a balanced diet: Eating nutrient-rich foods while avoiding excessive amounts of processed foods, sugar, and unhealthy fats. Follow-up is recommended in 4-8 weeks to assess progress, with a referral to behavioral health for further support if needed.

## 2023-10-03 ENCOUNTER — Other Ambulatory Visit: Payer: Self-pay | Admitting: Family Medicine

## 2023-10-03 DIAGNOSIS — K2100 Gastro-esophageal reflux disease with esophagitis, without bleeding: Secondary | ICD-10-CM

## 2023-10-07 ENCOUNTER — Encounter (INDEPENDENT_AMBULATORY_CARE_PROVIDER_SITE_OTHER): Payer: Self-pay | Admitting: Gastroenterology

## 2023-10-15 ENCOUNTER — Encounter: Payer: Self-pay | Admitting: Nurse Practitioner

## 2023-10-15 ENCOUNTER — Ambulatory Visit: Payer: No Typology Code available for payment source | Admitting: Nurse Practitioner

## 2023-10-15 VITALS — BP 140/92 | HR 99 | Temp 98.2°F

## 2023-10-15 DIAGNOSIS — J4521 Mild intermittent asthma with (acute) exacerbation: Secondary | ICD-10-CM

## 2023-10-15 DIAGNOSIS — J069 Acute upper respiratory infection, unspecified: Secondary | ICD-10-CM

## 2023-10-15 DIAGNOSIS — R030 Elevated blood-pressure reading, without diagnosis of hypertension: Secondary | ICD-10-CM

## 2023-10-15 MED ORDER — BENZONATATE 100 MG PO CAPS: 100.0000 mg | ORAL_CAPSULE | Freq: Two times a day (BID) | ORAL | 0 refills | Status: DC | PRN

## 2023-10-15 MED ORDER — PREDNISONE 20 MG PO TABS: 40.0000 mg | ORAL_TABLET | Freq: Every day | ORAL | 0 refills | Status: AC

## 2023-10-15 NOTE — Progress Notes (Addendum)
Acute Office Visit  Subjective:     Patient ID: Susan Wheeler, female    DOB: 1976/03/10, 47 y.o.   MRN: [id number]    HPI Patient is in today for cough and congestion x 1 week.  Some ear fullness Hx of asthma, usually mild and well-controlled. Today she endorses chest tightness - uses albuterol as needed. As been using about twice daily but feels that she needs it more frequently. Cough is worse at night  Denies fevers, although she has had chills. Denies chest pain or SOB at rest.  No recent flu/Covid contacts.Cough is productive.  Has tried otc dayquil and nyquil (last dose this am).  Eucalyptus and mint tea with honey, inhalers.    Review of Systems  Constitutional:  Positive for chills and malaise/fatigue. Negative for fever.  HENT:  Positive for congestion and sore throat. Negative for ear pain and sinus pain.   Respiratory:  Positive for cough, sputum production and shortness of breath. Negative for wheezing.   Cardiovascular:  Negative for chest pain.  Gastrointestinal:  Positive for nausea (with coughing).  Neurological:  Negative for dizziness and headaches.        Objective:    BP (!) 140/92   Pulse 99   Temp 98.2 F (36.8 C)   LMP 08/06/2015 (Approximate) Comment: hysterectomy  SpO2 99%    Physical Exam Constitutional:      General: She is not in acute distress. HENT:     Head: Normocephalic.     Right Ear: No middle ear effusion.     Left Ear: A middle ear effusion is present.     Nose:     Right Sinus: No maxillary sinus tenderness or frontal sinus tenderness.     Left Sinus: No maxillary sinus tenderness or frontal sinus tenderness.     Mouth/Throat:     Pharynx: Posterior oropharyngeal erythema present. No oropharyngeal exudate.  Cardiovascular:     Rate and Rhythm: Normal rate and regular rhythm.     Heart sounds: Normal heart sounds.  Pulmonary:     Effort: Pulmonary effort is normal. No respiratory distress.     Breath sounds: Normal breath  sounds. No wheezing.  Musculoskeletal:     Right lower leg: No edema.     Left lower leg: No edema.  Neurological:     General: No focal deficit present.     Mental Status: She is alert and oriented to person, place, and time.     No results found for any visits on 10/15/23.      Assessment & Plan:   Problem List Items Addressed This Visit       Respiratory   Asthma No wheezing present on exam; however, given history of asthma and increase albuterol use especially at night, will treat with short course of steroids.   Relevant Medications   benzonatate (TESSALON) 100 MG capsule   predniSONE (DELTASONE) 20 MG tablet   Other Visit Diagnoses       Acute URI    -  Primary Suspect viral in nature. Discussed supportive care measures at home; tessalon perles, mucinex BID, increase water intake, use of humidifiers. Discussed red flag symptoms and when to seek care.    Relevant Medications   benzonatate (TESSALON) 100 MG capsule   predniSONE (DELTASONE) 20 MG tablet     Elevated blood pressure reading      Likely transient due to acute illness. Encouraged monitoring readings at home. Follow-up if BP remains elevated above  130/80 after improvement in symptoms.      Meds ordered this encounter  Medications   benzonatate (TESSALON) 100 MG capsule    Sig: Take 1 capsule (100 mg total) by mouth 2 (two) times daily as needed for cough.    Dispense:  20 capsule    Refill:  0    Supervising Provider:   Erasmo Downer [[id number]]   predniSONE (DELTASONE) 20 MG tablet    Sig: Take 2 tablets (40 mg total) by mouth daily with breakfast for 5 days.    Dispense:  10 tablet    Refill:  0    Supervising Provider:   Erasmo Downer [[id number]]   Follow-up if symptoms persist or fail to improve.  Gloris Ham, NP

## 2023-10-17 ENCOUNTER — Other Ambulatory Visit: Payer: Self-pay | Admitting: Family Medicine

## 2023-10-17 DIAGNOSIS — K2100 Gastro-esophageal reflux disease with esophagitis, without bleeding: Secondary | ICD-10-CM
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
## 2023-10-19 NOTE — Patient Instructions (Signed)
        Great to see you today.  I have refilled the medication(s) we provide.    - Please take medications as prescribed. - Follow up with your primary health provider if any health concerns arises. - If symptoms worsen please contact your primary care provider and/or visit the emergency department.  

## 2023-10-20 ENCOUNTER — Encounter: Payer: Self-pay | Admitting: Family Medicine

## 2023-10-20 ENCOUNTER — Ambulatory Visit (INDEPENDENT_AMBULATORY_CARE_PROVIDER_SITE_OTHER): Payer: No Typology Code available for payment source | Admitting: Family Medicine

## 2023-10-20 VITALS — BP 128/86 | HR 83 | Ht 64.0 in | Wt 203.1 lb

## 2023-10-20 DIAGNOSIS — Z6834 Body mass index (BMI) 34.0-34.9, adult: Secondary | ICD-10-CM | POA: Diagnosis not present

## 2023-10-20 DIAGNOSIS — E66811 Obesity, class 1: Secondary | ICD-10-CM | POA: Diagnosis not present

## 2023-10-20 MED ORDER — SEMAGLUTIDE-WEIGHT MANAGEMENT 0.25 MG/0.5ML ~~LOC~~ SOAJ: 0.2500 mg | SUBCUTANEOUS | 0 refills | Status: DC

## 2023-10-20 MED ORDER — FLUTICASONE-SALMETEROL 115-21 MCG/ACT IN AERO: 2.0000 | INHALATION_SPRAY | Freq: Two times a day (BID) | RESPIRATORY_TRACT | 12 refills | Status: DC

## 2023-10-20 MED ORDER — SEMAGLUTIDE-WEIGHT MANAGEMENT 0.25 MG/0.5ML ~~LOC~~ SOAJ: 0.2500 mg | SUBCUTANEOUS | 0 refills | Status: AC

## 2023-10-20 MED ORDER — PAROXETINE HCL 20 MG PO TABS: 20.0000 mg | ORAL_TABLET | Freq: Every day | ORAL | 3 refills | Status: DC

## 2023-10-20 NOTE — Assessment & Plan Note (Signed)
Trial on Wegovy 0.25 mg weekly Started patient on weight management plan Discussed the importance to start eating 3 meals a day including breakfast, drink 8 glasses of water a day ,reduce portion sizes. reduced carbohydrates limit saturated and trans fat, increase servings of vegetables and limit processed foods. Find an activity that you will enjoy and start to be active at least 5 days a week for 30 minutes each day. Keep a food journal or an activity journal to identify triggers that lead to emotional eating

## 2023-10-23 ENCOUNTER — Telehealth: Payer: Self-pay

## 2023-10-25 NOTE — Telephone Encounter (Signed)
Okay thank you

## 2023-10-26 NOTE — Telephone Encounter (Signed)
Ok thank you 

## 2023-10-26 NOTE — Telephone Encounter (Signed)
No it's fine

## 2023-10-26 NOTE — Telephone Encounter (Signed)
Your welcome, would you like to send a PA for a different medication for this pt?

## 2023-10-30 ENCOUNTER — Other Ambulatory Visit: Payer: Self-pay | Admitting: Family Medicine

## 2023-11-03 ENCOUNTER — Ambulatory Visit (INDEPENDENT_AMBULATORY_CARE_PROVIDER_SITE_OTHER): Payer: No Typology Code available for payment source | Admitting: Gastroenterology

## 2023-11-05 ENCOUNTER — Other Ambulatory Visit: Payer: Self-pay | Admitting: Family Medicine

## 2023-11-05 DIAGNOSIS — K2100 Gastro-esophageal reflux disease with esophagitis, without bleeding: Secondary | ICD-10-CM

## 2023-11-17 ENCOUNTER — Encounter: Payer: Self-pay | Admitting: Family Medicine

## 2023-11-18 ENCOUNTER — Other Ambulatory Visit: Payer: Self-pay | Admitting: Family Medicine

## 2023-11-18 DIAGNOSIS — K2100 Gastro-esophageal reflux disease with esophagitis, without bleeding: Secondary | ICD-10-CM

## 2023-11-19 NOTE — Telephone Encounter (Signed)
Most likely paper script to sent over so I can sign

## 2023-11-23 ENCOUNTER — Encounter (INDEPENDENT_AMBULATORY_CARE_PROVIDER_SITE_OTHER): Payer: Self-pay | Admitting: Gastroenterology

## 2023-11-23 ENCOUNTER — Ambulatory Visit (INDEPENDENT_AMBULATORY_CARE_PROVIDER_SITE_OTHER): Payer: No Typology Code available for payment source | Admitting: Gastroenterology

## 2023-11-23 VITALS — BP 119/78 | HR 82 | Temp 99.0°F | Ht 64.0 in | Wt 199.6 lb

## 2023-11-23 DIAGNOSIS — R1310 Dysphagia, unspecified: Secondary | ICD-10-CM | POA: Diagnosis not present

## 2023-11-23 DIAGNOSIS — K5900 Constipation, unspecified: Secondary | ICD-10-CM

## 2023-11-23 DIAGNOSIS — K219 Gastro-esophageal reflux disease without esophagitis: Secondary | ICD-10-CM | POA: Diagnosis not present

## 2023-11-23 DIAGNOSIS — K625 Hemorrhage of anus and rectum: Secondary | ICD-10-CM | POA: Diagnosis not present

## 2023-11-23 MED ORDER — HYDROCORTISONE (PERIANAL) 2.5 % EX CREA
1.0000 | TOPICAL_CREAM | Freq: Three times a day (TID) | CUTANEOUS | 1 refills | Status: DC
Start: 2023-11-23 — End: 2024-03-22

## 2023-11-23 MED ORDER — PEG 3350-KCL-NA BICARB-NACL 420 G PO SOLR: 4000.0000 mL | Freq: Once | ORAL | 0 refills | Status: AC

## 2023-11-23 MED ORDER — OMEPRAZOLE 40 MG PO CPDR
40.0000 mg | DELAYED_RELEASE_CAPSULE | Freq: Every day | ORAL | 1 refills | Status: DC
Start: 2023-11-23 — End: 2023-12-15

## 2023-11-23 NOTE — Progress Notes (Signed)
Referring Provider: Wylene Men* Primary Care Physician:  Rica Records, FNP Primary GI Physician: new (Dr. Tasia Catchings)   Chief Complaint  Patient presents with   Hemorrhoids    Referred for hemorrhoids. States she has some bright red/pink bleeding at times. Tried otc creams and anusol suppositories.    Dysphagia    New patient. States she having issues swallowing meats mostly chicken and sometimes has to make herself vomit to get it up.    HPI:   Susan Wheeler is a 48 y.o. female with past medical history of anxiety, asthma, depression, GERD   Patient presenting today for rectal bleeding, GERD and dysphagia   Patient states issues with hemorrhoids for a while. She has tried otc and anusol suppositories without improvement. She has issues with them intermittently, worse at times than others. Has some rectal discomfort. Can see some toilet tissue hematochezia at times, she can often feel the hemorrhoids protruding when she wipes.   She does have issues with constipation, sometimes will use miralax which sometimes provides relief. She is not taking anything regularly but will take mrialax if she goes 3 days without a BM, usually goes no more than 2-3 days without defecating. Has some occasional bloating when she is constipated. Denies any melena.  Reports some dysphagia for the past few years. Does not feel that symptoms have progressed since onset though occurs more with meats like chicken. Sometimes has to cough the food back up as she cannot get it to pass. She feels that substances sit at sternal notch when dysphagia occurs. No issues with liquids.  She is taking protonix 40mg  BID, though still has breakthrough almost daily with acid regurgitation and heartburn. Has pepcid but has not been using this recently.   Social: no etoh or tobacco  Family: no crc, liver disease or pancreatic cancer she is aware of NSAIDs: No regular NSAID use   Last Colonoscopy:  never Last Endoscopy: never   Recommendations:    Past Medical History:  Diagnosis Date   Anxiety    Asthma 01/23/2022   BV (bacterial vaginosis) 04/25/2015   Depressive disorder 01/23/2022   Dyspareunia 04/25/2015   GERD (gastroesophageal reflux disease)    Vaginal discharge 04/25/2015   Vaginal itching 04/25/2015    Past Surgical History:  Procedure Laterality Date   CHOLECYSTECTOMY N/A 11/06/2014   Procedure: LAPAROSCOPIC CHOLECYSTECTOMY;  Surgeon: Dalia Heading, MD;  Location: [facility];  Service: General;  Laterality: N/A;   KNEE ARTHROSCOPY Left    MANDIBLE SURGERY     TUBAL LIGATION     VAGINAL HYSTERECTOMY N/A 08/15/2015   Procedure: HYSTERECTOMY VAGINAL;  Surgeon: Lazaro Arms, MD;  Location: [facility];  Service: Gynecology;  Laterality: N/A;    Current Outpatient Medications  Medication Sig Dispense Refill   acetaminophen (TYLENOL) 500 MG tablet Take 1,000 mg by mouth every 6 (six) hours as needed.     albuterol (VENTOLIN HFA) 108 (90 Base) MCG/ACT inhaler Inhale 2 puffs into the lungs every 6 (six) hours as needed for wheezing or shortness of breath. 8 g 2   benzonatate (TESSALON) 100 MG capsule Take 1 capsule (100 mg total) by mouth 2 (two) times daily as needed for cough. 20 capsule 0   docusate sodium (COLACE) 100 MG capsule Take 1 capsule (100 mg total) by mouth daily. (Patient taking differently: Take 100 mg by mouth daily as needed.) 30 capsule 2   estradiol (ESTRACE) 0.1 MG/GM vaginal cream 1 gram  at bedtime 30 g 12   estradiol (VIVELLE-DOT) 0.1 MG/24HR patch Place 1 patch (0.1 mg total) onto the skin 2 (two) times a week. 8 patch 12   famotidine (PEPCID) 20 MG tablet Take 20 mg by mouth daily as needed.     fluticasone-salmeterol (ADVAIR HFA) 115-21 MCG/ACT inhaler Inhale 2 puffs into the lungs 2 (two) times daily. 1 each 12   montelukast (SINGULAIR) 10 MG tablet Take 1 tablet (10 mg total) by mouth at bedtime. 30 tablet 3   Multiple Vitamin (MULTIVITAMIN ADULT  PO) Take 1 tablet by mouth daily.     pantoprazole (PROTONIX) 40 MG tablet Take 1 tablet by mouth twice daily 30 tablet 0   PARoxetine (PAXIL) 20 MG tablet Take 1 tablet (20 mg total) by mouth daily. 30 tablet 3   topiramate (TOPAMAX) 25 MG tablet Take 1 tablet (25 mg total) by mouth daily. 30 tablet 3   No current facility-administered medications for this visit.    Allergies as of 11/23/2023 - Review Complete 11/23/2023  Allergen Reaction Noted   Codeine Hives 08/16/2014   Dilaudid [hydromorphone] Nausea And Vomiting 09/26/2015   Penicillins Hives and Nausea And Vomiting 10/23/2014    Family History  Problem Relation Age of Onset   Migraines Mother    Asthma Daughter    Asthma Son    Cancer Maternal Grandmother    COPD Maternal Grandfather    Asthma Son     Social History   Socioeconomic History   Marital status: Married    Spouse name: Not on file   Number of children: Not on file   Years of education: Not on file   Highest education level: Not on file  Occupational History   Not on file  Tobacco Use   Smoking status: Never   Smokeless tobacco: Never  Vaping Use   Vaping status: Never Used  Substance and Sexual Activity   Alcohol use: No   Drug use: No   Sexual activity: Not Currently    Birth control/protection: Surgical    Comment: tubal  Other Topics Concern   Not on file  Social History Narrative   Not on file   Social Drivers of Health   Financial Resource Strain: Not on file  Food Insecurity: Not on file  Transportation Needs: Not on file  Physical Activity: Not on file  Stress: Not on file  Social Connections: Not on file    Review of systems General: negative for malaise, night sweats, fever, chills, weight los Neck: Negative for lumps, goiter, pain and significant neck swelling Resp: Negative for cough, wheezing, dyspnea at rest CV: Negative for chest pain, leg swelling, palpitations, orthopnea GI: denies melena, hematochezia, nausea,  vomiting, diarrhea, constipation, dysphagia, odyonophagia, early satiety or unintentional weight loss.  MSK: Negative for joint pain or swelling, back pain, and muscle pain. Derm: Negative for itching or rash Psych: Denies depression, anxiety, memory loss, confusion. No homicidal or suicidal ideation.  Heme: Negative for prolonged bleeding, bruising easily, and swollen nodes. Endocrine: Negative for cold or heat intolerance, polyuria, polydipsia and goiter. Neuro: negative for tremor, gait imbalance, syncope and seizures. The remainder of the review of systems is noncontributory.  Physical Exam: BP 119/78   Pulse 82   Temp 99 F (37.2 C) (Oral)   Ht 5\' 4"  (1.626 m)   Wt 199 lb 9.6 oz (90.5 kg)   LMP 08/06/2015 (Approximate) Comment: hysterectomy  BMI 34.26 kg/m  General:   Alert and oriented. No distress  noted. Pleasant and cooperative.  Head:  Normocephalic and atraumatic. Eyes:  Conjuctiva clear without scleral icterus. Mouth:  Oral mucosa pink and moist. Good dentition. No lesions. Heart: Normal rate and rhythm, s1 and s2 heart sounds present.  Lungs: Clear lung sounds in all lobes. Respirations equal and unlabored. Abdomen:  +BS, soft, non-tender and non-distended. No rebound or guarding. No HSM or masses noted. Derm: No palmar erythema or jaundice Msk:  Symmetrical without gross deformities. Normal posture. Extremities:  Without edema. Neurologic:  Alert and  oriented x4 Psych:  Alert and cooperative. Normal mood and affect.  Invalid input(s): "6 MONTHS"   ASSESSMENT: Susan Wheeler is a 48 y.o. female presenting today as a new patient for rectal bleeding, GERD and Dysphagia  Rectal bleeding: Intermittent rectal bleeding over the past few years, she does note feeling some hemorrhoids protruding and some rectal discomfort at times.  Does have history of constipation.  Given patient is 60 and has had no screening colonoscopies in the past as well as having rectal bleeding  would recommend proceeding with colonoscopy.  Instructed on the importance of avoiding straining, limiting toilet time and keeping stools nice and soft.  Will also send Anusol cream at this time but can consider hemorrhoid banding after colonoscopy if patient is interested.  Constipation: Having harder stools, suspected hemorrhoids as above.  Recent thyroid panel/BMP WNL, Not taking anything regularly for her constipation.  Recommended good water intake, diet high in fruits, veggies, whole grains, can start MiraLAX 17 g daily x 2 weeks as well as Benefiber 1 to 2 tablespoons daily with a meal, if constipation is improved after 2 weeks can stop MiraLAX and continue Benefiber.  GERD/Dysphagia: Long history of GERD, maintained on Protonix 40 mg twice daily with frequent breakthrough GERD symptoms.  She notes ongoing dysphagia over the past few years, usually worse with meats like chicken.  At this time recommend stopping Protonix and starting omeprazole 40 mg daily as well as scheduling EGD given her dysphagia and long history of GERD.  Indications, risks and benefits of procedure discussed in detail with patient. Patient verbalized understanding and is in agreement to proceed with EGD +/-dilation and Colonoscopy.   PLAN:  -schedule EGD +/- dilation and colonoscopy-ASA II  -stop protonix -start omeprazole 40mg  daily  -consider hemorrhoid banding after colonoscopy  -Increase water intake, aim for atleast 64 oz per day Increase fruits, veggies and whole grains, kiwi and prunes are especially good for constipation -anusol cream TID  -please start miralax 17g daily x2 weeks and benefiber 1-2T daily with a meal, if constipation has improved after 2 weeks you can stop miralax, continue benefiber  All questions were answered, patient verbalized understanding and is in agreement with plan as outlined above.    Follow Up: 3-4 months   Lanesha Azzaro L. Jeanmarie Hubert, MSN, APRN, AGNP-C Adult-Gerontology Nurse  Practitioner [facility]

## 2023-11-23 NOTE — H&P (View-Only) (Signed)
Referring Provider: Wylene Men* Primary Care Physician:  Rica Records, FNP Primary GI Physician: new (Dr. Tasia Catchings)   Chief Complaint  Patient presents with   Hemorrhoids    Referred for hemorrhoids. States she has some bright red/pink bleeding at times. Tried otc creams and anusol suppositories.    Dysphagia    New patient. States she having issues swallowing meats mostly chicken and sometimes has to make herself vomit to get it up.    HPI:   Susan Wheeler is a 48 y.o. female with past medical history of anxiety, asthma, depression, GERD   Patient presenting today for rectal bleeding, GERD and dysphagia   Patient states issues with hemorrhoids for a while. She has tried otc and anusol suppositories without improvement. She has issues with them intermittently, worse at times than others. Has some rectal discomfort. Can see some toilet tissue hematochezia at times, she can often feel the hemorrhoids protruding when she wipes.   She does have issues with constipation, sometimes will use miralax which sometimes provides relief. She is not taking anything regularly but will take mrialax if she goes 3 days without a BM, usually goes no more than 2-3 days without defecating. Has some occasional bloating when she is constipated. Denies any melena.  Reports some dysphagia for the past few years. Does not feel that symptoms have progressed since onset though occurs more with meats like chicken. Sometimes has to cough the food back up as she cannot get it to pass. She feels that substances sit at sternal notch when dysphagia occurs. No issues with liquids.  She is taking protonix 40mg  BID, though still has breakthrough almost daily with acid regurgitation and heartburn. Has pepcid but has not been using this recently.   Social: no etoh or tobacco  Family: no crc, liver disease or pancreatic cancer she is aware of NSAIDs: No regular NSAID use   Last Colonoscopy:  never Last Endoscopy: never   Recommendations:    Past Medical History:  Diagnosis Date   Anxiety    Asthma 01/23/2022   BV (bacterial vaginosis) 04/25/2015   Depressive disorder 01/23/2022   Dyspareunia 04/25/2015   GERD (gastroesophageal reflux disease)    Vaginal discharge 04/25/2015   Vaginal itching 04/25/2015    Past Surgical History:  Procedure Laterality Date   CHOLECYSTECTOMY N/A 11/06/2014   Procedure: LAPAROSCOPIC CHOLECYSTECTOMY;  Surgeon: Dalia Heading, MD;  Location: [facility];  Service: General;  Laterality: N/A;   KNEE ARTHROSCOPY Left    MANDIBLE SURGERY     TUBAL LIGATION     VAGINAL HYSTERECTOMY N/A 08/15/2015   Procedure: HYSTERECTOMY VAGINAL;  Surgeon: Lazaro Arms, MD;  Location: [facility];  Service: Gynecology;  Laterality: N/A;    Current Outpatient Medications  Medication Sig Dispense Refill   acetaminophen (TYLENOL) 500 MG tablet Take 1,000 mg by mouth every 6 (six) hours as needed.     albuterol (VENTOLIN HFA) 108 (90 Base) MCG/ACT inhaler Inhale 2 puffs into the lungs every 6 (six) hours as needed for wheezing or shortness of breath. 8 g 2   benzonatate (TESSALON) 100 MG capsule Take 1 capsule (100 mg total) by mouth 2 (two) times daily as needed for cough. 20 capsule 0   docusate sodium (COLACE) 100 MG capsule Take 1 capsule (100 mg total) by mouth daily. (Patient taking differently: Take 100 mg by mouth daily as needed.) 30 capsule 2   estradiol (ESTRACE) 0.1 MG/GM vaginal cream 1 gram  at bedtime 30 g 12   estradiol (VIVELLE-DOT) 0.1 MG/24HR patch Place 1 patch (0.1 mg total) onto the skin 2 (two) times a week. 8 patch 12   famotidine (PEPCID) 20 MG tablet Take 20 mg by mouth daily as needed.     fluticasone-salmeterol (ADVAIR HFA) 115-21 MCG/ACT inhaler Inhale 2 puffs into the lungs 2 (two) times daily. 1 each 12   montelukast (SINGULAIR) 10 MG tablet Take 1 tablet (10 mg total) by mouth at bedtime. 30 tablet 3   Multiple Vitamin (MULTIVITAMIN ADULT  PO) Take 1 tablet by mouth daily.     pantoprazole (PROTONIX) 40 MG tablet Take 1 tablet by mouth twice daily 30 tablet 0   PARoxetine (PAXIL) 20 MG tablet Take 1 tablet (20 mg total) by mouth daily. 30 tablet 3   topiramate (TOPAMAX) 25 MG tablet Take 1 tablet (25 mg total) by mouth daily. 30 tablet 3   No current facility-administered medications for this visit.    Allergies as of 11/23/2023 - Review Complete 11/23/2023  Allergen Reaction Noted   Codeine Hives 08/16/2014   Dilaudid [hydromorphone] Nausea And Vomiting 09/26/2015   Penicillins Hives and Nausea And Vomiting 10/23/2014    Family History  Problem Relation Age of Onset   Migraines Mother    Asthma Daughter    Asthma Son    Cancer Maternal Grandmother    COPD Maternal Grandfather    Asthma Son     Social History   Socioeconomic History   Marital status: Married    Spouse name: Not on file   Number of children: Not on file   Years of education: Not on file   Highest education level: Not on file  Occupational History   Not on file  Tobacco Use   Smoking status: Never   Smokeless tobacco: Never  Vaping Use   Vaping status: Never Used  Substance and Sexual Activity   Alcohol use: No   Drug use: No   Sexual activity: Not Currently    Birth control/protection: Surgical    Comment: tubal  Other Topics Concern   Not on file  Social History Narrative   Not on file   Social Drivers of Health   Financial Resource Strain: Not on file  Food Insecurity: Not on file  Transportation Needs: Not on file  Physical Activity: Not on file  Stress: Not on file  Social Connections: Not on file    Review of systems General: negative for malaise, night sweats, fever, chills, weight los Neck: Negative for lumps, goiter, pain and significant neck swelling Resp: Negative for cough, wheezing, dyspnea at rest CV: Negative for chest pain, leg swelling, palpitations, orthopnea GI: denies melena, hematochezia, nausea,  vomiting, diarrhea, constipation, dysphagia, odyonophagia, early satiety or unintentional weight loss.  MSK: Negative for joint pain or swelling, back pain, and muscle pain. Derm: Negative for itching or rash Psych: Denies depression, anxiety, memory loss, confusion. No homicidal or suicidal ideation.  Heme: Negative for prolonged bleeding, bruising easily, and swollen nodes. Endocrine: Negative for cold or heat intolerance, polyuria, polydipsia and goiter. Neuro: negative for tremor, gait imbalance, syncope and seizures. The remainder of the review of systems is noncontributory.  Physical Exam: BP 119/78   Pulse 82   Temp 99 F (37.2 C) (Oral)   Ht 5\' 4"  (1.626 m)   Wt 199 lb 9.6 oz (90.5 kg)   LMP 08/06/2015 (Approximate) Comment: hysterectomy  BMI 34.26 kg/m  General:   Alert and oriented. No distress  noted. Pleasant and cooperative.  Head:  Normocephalic and atraumatic. Eyes:  Conjuctiva clear without scleral icterus. Mouth:  Oral mucosa pink and moist. Good dentition. No lesions. Heart: Normal rate and rhythm, s1 and s2 heart sounds present.  Lungs: Clear lung sounds in all lobes. Respirations equal and unlabored. Abdomen:  +BS, soft, non-tender and non-distended. No rebound or guarding. No HSM or masses noted. Derm: No palmar erythema or jaundice Msk:  Symmetrical without gross deformities. Normal posture. Extremities:  Without edema. Neurologic:  Alert and  oriented x4 Psych:  Alert and cooperative. Normal mood and affect.  Invalid input(s): "6 MONTHS"   ASSESSMENT: BRAYDEN BETTERS is a 48 y.o. female presenting today as a new patient for rectal bleeding, GERD and Dysphagia  Rectal bleeding: Intermittent rectal bleeding over the past few years, she does note feeling some hemorrhoids protruding and some rectal discomfort at times.  Does have history of constipation.  Given patient is 32 and has had no screening colonoscopies in the past as well as having rectal bleeding  would recommend proceeding with colonoscopy.  Instructed on the importance of avoiding straining, limiting toilet time and keeping stools nice and soft.  Will also send Anusol cream at this time but can consider hemorrhoid banding after colonoscopy if patient is interested.  Constipation: Having harder stools, suspected hemorrhoids as above.  Recent thyroid panel/BMP WNL, Not taking anything regularly for her constipation.  Recommended good water intake, diet high in fruits, veggies, whole grains, can start MiraLAX 17 g daily x 2 weeks as well as Benefiber 1 to 2 tablespoons daily with a meal, if constipation is improved after 2 weeks can stop MiraLAX and continue Benefiber.  GERD/Dysphagia: Long history of GERD, maintained on Protonix 40 mg twice daily with frequent breakthrough GERD symptoms.  She notes ongoing dysphagia over the past few years, usually worse with meats like chicken.  At this time recommend stopping Protonix and starting omeprazole 40 mg daily as well as scheduling EGD given her dysphagia and long history of GERD.  Indications, risks and benefits of procedure discussed in detail with patient. Patient verbalized understanding and is in agreement to proceed with EGD +/-dilation and Colonoscopy.   PLAN:  -schedule EGD +/- dilation and colonoscopy-ASA II  -stop protonix -start omeprazole 40mg  daily  -consider hemorrhoid banding after colonoscopy  -Increase water intake, aim for atleast 64 oz per day Increase fruits, veggies and whole grains, kiwi and prunes are especially good for constipation -anusol cream TID  -please start miralax 17g daily x2 weeks and benefiber 1-2T daily with a meal, if constipation has improved after 2 weeks you can stop miralax, continue benefiber  All questions were answered, patient verbalized understanding and is in agreement with plan as outlined above.    Follow Up: 3-4 months   Susan Demetrius L. Jeanmarie Hubert, MSN, APRN, AGNP-C Adult-Gerontology Nurse  Practitioner [facility]

## 2023-11-23 NOTE — Patient Instructions (Signed)
-  we will schedule EGD and colonoscopy -stop protonix -start omeprazole 40mg  daily  -consider hemorrhoid banding after colonoscopy, I have provided a brochure for this  -Increase water intake, aim for atleast 64 oz per day Increase fruits, veggies and whole grains, kiwi and prunes are especially good for constipation -anusol cream TID  -please start miralax 17g daily x2 weeks and benefiber 1-2T daily with a meal, if constipation has improved after 2 weeks you can stop miralax, continue benefiber  Follow up 4 months  It was a pleasure to see you today. I want to create trusting relationships with patients and provide genuine, compassionate, and quality care. I truly value your feedback! please be on the lookout for a survey regarding your visit with me today. I appreciate your input about our visit and your time in completing this!    Susan Wheeler L. Jeanmarie Hubert, MSN, APRN, AGNP-C Adult-Gerontology Nurse Practitioner [facility]

## 2023-11-26 ENCOUNTER — Encounter: Payer: Self-pay | Admitting: Family Medicine

## 2023-11-27 ENCOUNTER — Telehealth: Payer: Self-pay

## 2023-11-27 NOTE — Telephone Encounter (Signed)
Contacted pt. For more information about pharmacy for further assistance.

## 2023-11-30 ENCOUNTER — Other Ambulatory Visit: Payer: Self-pay | Admitting: Family Medicine

## 2023-11-30 DIAGNOSIS — J4520 Mild intermittent asthma, uncomplicated: Secondary | ICD-10-CM

## 2023-11-30 NOTE — Telephone Encounter (Signed)
Sent to provider for review and new plan of treatment.

## 2023-12-01 ENCOUNTER — Ambulatory Visit: Payer: No Typology Code available for payment source | Admitting: Internal Medicine

## 2023-12-15 ENCOUNTER — Other Ambulatory Visit: Payer: Self-pay

## 2023-12-15 ENCOUNTER — Encounter (HOSPITAL_COMMUNITY): Payer: Self-pay | Admitting: Gastroenterology

## 2023-12-15 ENCOUNTER — Ambulatory Visit (HOSPITAL_COMMUNITY): Payer: No Typology Code available for payment source | Admitting: Anesthesiology

## 2023-12-15 ENCOUNTER — Ambulatory Visit (HOSPITAL_COMMUNITY)
Admission: RE | Admit: 2023-12-15 | Discharge: 2023-12-15 | Disposition: A | Discharge status: Home / Self Care | Payer: No Typology Code available for payment source | Attending: Gastroenterology | Admitting: Gastroenterology

## 2023-12-15 ENCOUNTER — Encounter (HOSPITAL_COMMUNITY)
Admission: RE | Disposition: A | Discharge status: Home / Self Care | Payer: Self-pay | Source: Home / Self Care | Attending: Gastroenterology

## 2023-12-15 DIAGNOSIS — K200 Eosinophilic esophagitis: Secondary | ICD-10-CM

## 2023-12-15 DIAGNOSIS — K219 Gastro-esophageal reflux disease without esophagitis: Secondary | ICD-10-CM | POA: Insufficient documentation

## 2023-12-15 DIAGNOSIS — Z1211 Encounter for screening for malignant neoplasm of colon: Secondary | ICD-10-CM | POA: Insufficient documentation

## 2023-12-15 DIAGNOSIS — K5730 Diverticulosis of large intestine without perforation or abscess without bleeding: Secondary | ICD-10-CM | POA: Insufficient documentation

## 2023-12-15 DIAGNOSIS — K648 Other hemorrhoids: Secondary | ICD-10-CM | POA: Diagnosis not present

## 2023-12-15 DIAGNOSIS — K3189 Other diseases of stomach and duodenum: Secondary | ICD-10-CM | POA: Diagnosis not present

## 2023-12-15 DIAGNOSIS — Z7951 Long term (current) use of inhaled steroids: Secondary | ICD-10-CM | POA: Diagnosis not present

## 2023-12-15 DIAGNOSIS — K2289 Other specified disease of esophagus: Secondary | ICD-10-CM

## 2023-12-15 DIAGNOSIS — J45909 Unspecified asthma, uncomplicated: Secondary | ICD-10-CM | POA: Diagnosis not present

## 2023-12-15 DIAGNOSIS — R1310 Dysphagia, unspecified: Secondary | ICD-10-CM | POA: Insufficient documentation

## 2023-12-15 DIAGNOSIS — K449 Diaphragmatic hernia without obstruction or gangrene: Secondary | ICD-10-CM | POA: Diagnosis not present

## 2023-12-15 DIAGNOSIS — K641 Second degree hemorrhoids: Secondary | ICD-10-CM

## 2023-12-15 DIAGNOSIS — K644 Residual hemorrhoidal skin tags: Secondary | ICD-10-CM | POA: Insufficient documentation

## 2023-12-15 DIAGNOSIS — K222 Esophageal obstruction: Secondary | ICD-10-CM | POA: Diagnosis not present

## 2023-12-15 HISTORY — PX: SAVORY DILATION: SHX5439

## 2023-12-15 HISTORY — PX: COLONOSCOPY WITH PROPOFOL: SHX5780

## 2023-12-15 HISTORY — PX: ESOPHAGOGASTRODUODENOSCOPY (EGD) WITH PROPOFOL: SHX5813

## 2023-12-15 HISTORY — PX: BIOPSY: SHX5522

## 2023-12-15 LAB — HM COLONOSCOPY

## 2023-12-15 SURGERY — COLONOSCOPY WITH PROPOFOL
Anesthesia: Choice

## 2023-12-15 MED ORDER — FENTANYL CITRATE (PF) 100 MCG/2ML IJ SOLN: 25.0000 ug | INTRAMUSCULAR | Status: DC | PRN

## 2023-12-15 MED ORDER — METOCLOPRAMIDE HCL 5 MG/ML IJ SOLN: 10.0000 mg | Freq: Once | INTRAMUSCULAR | Status: DC | PRN

## 2023-12-15 MED ORDER — LACTATED RINGERS IV SOLN: INTRAVENOUS | Status: DC

## 2023-12-15 MED ORDER — LIDOCAINE HCL (CARDIAC) PF 100 MG/5ML IV SOSY
PREFILLED_SYRINGE | INTRAVENOUS | Status: DC | PRN
  Administered 2023-12-15: 100 mg via INTRATRACHEAL

## 2023-12-15 MED ORDER — OMEPRAZOLE 40 MG PO CPDR: 40.0000 mg | DELAYED_RELEASE_CAPSULE | Freq: Two times a day (BID) | ORAL | 1 refills | Status: DC

## 2023-12-15 MED ORDER — PROPOFOL 10 MG/ML IV BOLUS
INTRAVENOUS | Status: DC | PRN
Start: 2023-12-15 — End: 2023-12-15
  Administered 2023-12-15: 20 mg via INTRAVENOUS
  Administered 2023-12-15: 25 mg via INTRAVENOUS
  Administered 2023-12-15: 100 mg via INTRAVENOUS
  Administered 2023-12-15: 35 mg via INTRAVENOUS
  Administered 2023-12-15: 50 mg via INTRAVENOUS

## 2023-12-15 MED ORDER — HYDROCORTISONE ACETATE 25 MG RE SUPP: 25.0000 mg | Freq: Two times a day (BID) | RECTAL | 0 refills | Status: DC

## 2023-12-15 MED ORDER — PSYLLIUM 58.6 % PO PACK: 1.0000 | PACK | Freq: Two times a day (BID) | ORAL | 2 refills | Status: AC

## 2023-12-15 MED ORDER — IPRATROPIUM-ALBUTEROL 0.5-2.5 (3) MG/3ML IN SOLN
3.0000 mL | Freq: Once | RESPIRATORY_TRACT | Status: AC
  Administered 2023-12-15: 3 mL via RESPIRATORY_TRACT

## 2023-12-15 MED ORDER — MEPERIDINE HCL 50 MG/ML IJ SOLN: 6.2500 mg | INTRAMUSCULAR | Status: DC | PRN

## 2023-12-15 MED ORDER — PROPOFOL 500 MG/50ML IV EMUL
INTRAVENOUS | Status: DC | PRN
  Administered 2023-12-15: 150 ug/kg/min via INTRAVENOUS

## 2023-12-15 MED ORDER — POLYETHYLENE GLYCOL 3350 17 G PO PACK: 17.0000 g | PACK | Freq: Two times a day (BID) | ORAL | 0 refills | Status: AC

## 2023-12-15 MED ORDER — LACTATED RINGERS IV SOLN: INTRAVENOUS | Status: DC | PRN

## 2023-12-15 MED ORDER — IPRATROPIUM-ALBUTEROL 0.5-2.5 (3) MG/3ML IN SOLN
RESPIRATORY_TRACT | Status: AC
  Filled 2023-12-15: qty 3

## 2023-12-15 NOTE — Discharge Instructions (Signed)

## 2023-12-15 NOTE — Progress Notes (Signed)
Patient arrived to post op and abrasions on right lower lip noted. Patient educated on cause and care for this. Patient was administered nebulizer treatment and Clydie Braun, CRNA returned to post op to see patient at end of this treatment.

## 2023-12-15 NOTE — Anesthesia Preprocedure Evaluation (Signed)
Anesthesia Evaluation  Patient identified by MRN, date of birth, ID band Patient awake    Reviewed: Allergy & Precautions, H&P , NPO status , Patient's Chart, lab work & pertinent test results  Airway Mallampati: II  TM Distance: >3 FB Neck ROM: Full    Dental no notable dental hx. (+) Teeth Intact   Pulmonary asthma    Pulmonary exam normal breath sounds clear to auscultation       Cardiovascular negative cardio ROS Normal cardiovascular exam Rhythm:Regular Rate:Normal     Neuro/Psych  Headaches PSYCHIATRIC DISORDERS Anxiety Depression       GI/Hepatic Neg liver ROS,GERD  ,,  Endo/Other  negative endocrine ROS    Renal/GU negative Renal ROS  negative genitourinary   Musculoskeletal negative musculoskeletal ROS (+)    Abdominal  (+) + obese  Peds negative pediatric ROS (+)  Hematology negative hematology ROS (+)   Anesthesia Other Findings   Reproductive/Obstetrics negative OB ROS                             Anesthesia Physical Anesthesia Plan  ASA: 2  Anesthesia Plan:    Post-op Pain Management: Minimal or no pain anticipated   Induction: Intravenous  PONV Risk Score and Plan: 1  Airway Management Planned: Simple Face Mask  Additional Equipment:   Intra-op Plan:   Post-operative Plan:   Informed Consent: I have reviewed the patients History and Physical, chart, labs and discussed the procedure including the risks, benefits and alternatives for the proposed anesthesia with the patient or authorized representative who has indicated his/her understanding and acceptance.       Plan Discussed with: CRNA  Anesthesia Plan Comments:        Anesthesia Quick Evaluation

## 2023-12-15 NOTE — Anesthesia Postprocedure Evaluation (Signed)
Anesthesia Post Note  Patient: ARIDAY BRINKER  Procedure(s) Performed: COLONOSCOPY WITH PROPOFOL ESOPHAGOGASTRODUODENOSCOPY (EGD) WITH PROPOFOL BIOPSY SAVORY DILATION  Patient location during evaluation: PACU Anesthesia Type: General Level of consciousness: awake and alert Pain management: pain level controlled Vital Signs Assessment: post-procedure vital signs reviewed and stable Respiratory status: spontaneous breathing, nonlabored ventilation and respiratory function stable Cardiovascular status: blood pressure returned to baseline and stable Postop Assessment: no apparent nausea or vomiting Anesthetic complications: no   No notable events documented.   Last Vitals:  Vitals:   12/15/23 1112 12/15/23 1118  BP: 97/65   Pulse: 85 87  Resp: 15 16  Temp:    SpO2: 100%     Last Pain:  Vitals:   12/15/23 1107  TempSrc:   PainSc: 0-No pain                 Roslynn Amble

## 2023-12-15 NOTE — Op Note (Signed)
[facility] Patient Name: Susan Wheeler Procedure Date: 12/15/2023 9:58 AM MRN: [id number] Date of Birth: Apr 01, 1976 Attending MD: Sanjuan Dame , MD, [id number] CSN: [id number] Age: 48 Admit Type: Outpatient Procedure:                Upper GI endoscopy Indications:              Dysphagia Providers:                Sanjuan Dame, MD, Sheran Fava, Lennice Sites                            Technician, Technician Referring MD:              Medicines:                Monitored Anesthesia Care Complications:            No immediate complications. Estimated Blood Loss:     Estimated blood loss was minimal. Procedure:                Pre-Anesthesia Assessment:                           - Prior to the procedure, a History and Physical                            was performed, and patient medications and                            allergies were reviewed. The patient's tolerance of                            previous anesthesia was also reviewed. The risks                            and benefits of the procedure and the sedation                            options and risks were discussed with the patient.                            All questions were answered, and informed consent                            was obtained. Prior Anticoagulants: The patient has                            taken no anticoagulant or antiplatelet agents. ASA                            Grade Assessment: II - A patient with mild systemic                            disease. After reviewing the risks and benefits,  the patient was deemed in satisfactory condition to                            undergo the procedure.                           After obtaining informed consent, the endoscope was                            passed under direct vision. Throughout the                            procedure, the patient's blood pressure, pulse, and                            oxygen saturations were  monitored continuously. The                            [id number]-H190 ([id number]) scope was introduced through the                            mouth, and advanced to the second part of duodenum.                            The upper GI endoscopy was accomplished without                            difficulty. The patient tolerated the procedure                            well. Scope In: 10:11:19 AM Scope Out: 10:22:02 AM Total Procedure Duration: 0 hours 10 minutes 43 seconds  Findings:      Mucosal changes including ringed esophagus and longitudinal furrows were       found in the entire esophagus. Esophageal findings were graded using the       Eosinophilic Esophagitis Endoscopic Reference Score (EoE-EREFS) as:       Edema Grade 0 Normal (distinct vascular markings), Rings Grade 1 Mild       (subtle circumferential ridges seen on esophageal distension), Exudates       Grade 0 None (no white lesions seen), Furrows Grade 1 Mild (vertical       lines without visible depth) and Stricture none (no stricture found).       Biopsies were obtained from the proximal and distal esophagus with cold       forceps for histology of suspected eosinophilic esophagitis. A guidewire       was placed and the scope was withdrawn. Dilation was performed with a       Savary dilator with mild resistance at 12 mm. The dilation site was       examined following endoscope reinsertion and showed mild mucosal       disruption.      A 2 cm hiatal hernia was present.      Mildly erythematous mucosa without bleeding was found in the gastric       antrum. Biopsies were taken with a cold forceps for histology.  The duodenal bulb and second portion of the duodenum were normal. Impression:               - Esophageal mucosal changes suspicious for                            eosinophilic esophagitis. Dilated.                           - 2 cm hiatal hernia.                           - Erythematous mucosa in the antrum.  Biopsied.                           - Normal duodenal bulb and second portion of the                            duodenum.                           - Biopsies were taken with a cold forceps for                            evaluation of eosinophilic esophagitis. Moderate Sedation:      Per Anesthesia Care Recommendation:           - Patient has a contact number available for                            emergencies. The signs and symptoms of potential                            delayed complications were discussed with the                            patient. Return to normal activities tomorrow.                            Written discharge instructions were provided to the                            patient.                           - Resume previous diet.                           - Continue present medications.                           - Await pathology results.                           - Repeat upper endoscopy for surveillance based on                            pathology results.                           -  Return to GI clinic as previously scheduled.                           -PPI BID Procedure Code(s):        --- Professional ---                           [phone], Esophagogastroduodenoscopy, flexible,                            transoral; with insertion of guide wire followed by                            passage of dilator(s) through esophagus over guide                            wire                           [id number], 59, Esophagogastroduodenoscopy, flexible,                            transoral; with biopsy, single or multiple Diagnosis Code(s):        --- Professional ---                           K22.89, Other specified disease of esophagus                           K31.89, Other diseases of stomach and duodenum                           R13.10, Dysphagia, unspecified CPT copyright 2022 American Medical Association. All rights reserved. The codes documented in this report are  preliminary and upon coder review may  be revised to meet current compliance requirements. Sanjuan Dame, MD Sanjuan Dame, MD 12/15/2023 10:51:57 AM This report has been signed electronically. Number of Addenda: 0

## 2023-12-15 NOTE — Transfer of Care (Signed)
Immediate Anesthesia Transfer of Care Note  Patient: Susan Wheeler  Procedure(s) Performed: COLONOSCOPY WITH PROPOFOL ESOPHAGOGASTRODUODENOSCOPY (EGD) WITH PROPOFOL MALONEY DILATION BIOPSY  Patient Location: Endoscopy Unit  Anesthesia Type:General  Level of Consciousness: awake  Airway & Oxygen Therapy: Patient Spontanous Breathing  Post-op Assessment: Report given to RN  Post vital signs: Reviewed and stable  Last Vitals:  Vitals Value Taken Time  BP 102/69 12/15/23 1107  Temp 36.7 C 12/15/23 1103  Pulse 87 12/15/23 1107  Resp 15 12/15/23 1107  SpO2 97 % 12/15/23 1107    Last Pain:  Vitals:   12/15/23 1107  TempSrc:   PainSc: 0-No pain      Patients Stated Pain Goal: 8 (12/15/23 0937)  Complications: No notable events documented.

## 2023-12-15 NOTE — Interval H&P Note (Signed)
History and Physical Interval Note:  12/15/2023 9:52 AM  Susan Wheeler  has presented today for surgery, with the diagnosis of chronic gerd, rectal bleeding.  The various methods of treatment have been discussed with the patient and family. After consideration of risks, benefits and other options for treatment, the patient has consented to  Procedure(s) with comments: COLONOSCOPY WITH PROPOFOL (N/A) - 9:45am;asa 2 ESOPHAGOGASTRODUODENOSCOPY (EGD) WITH PROPOFOL (N/A) - 9:45am;asa 2 MALONEY DILATION (N/A) - 9:45am;asa 2 as a surgical intervention.  The patient's history has been reviewed, patient examined, no change in status, stable for surgery.  I have reviewed the patient's chart and labs.  Questions were answered to the patient's satisfaction.     [name]

## 2023-12-15 NOTE — Op Note (Signed)
[facility] Patient Name: Susan Wheeler Procedure Date: 12/15/2023 9:51 AM MRN: [id number] Date of Birth: April 12, 1976 Attending MD: Sanjuan Dame , MD, [id number] CSN: [id number] Age: 48 Admit Type: Outpatient Procedure:                Colonoscopy Indications:              Screening for colorectal malignant neoplasm Providers:                Sanjuan Dame, MD, Sheran Fava, Lennice Sites                            Technician, Technician Referring MD:              Medicines:                Monitored Anesthesia Care Complications:            No immediate complications. Estimated Blood Loss:     Estimated blood loss: none. Procedure:                Pre-Anesthesia Assessment:                           - Prior to the procedure, a History and Physical                            was performed, and patient medications and                            allergies were reviewed. The patient's tolerance of                            previous anesthesia was also reviewed. The risks                            and benefits of the procedure and the sedation                            options and risks were discussed with the patient.                            All questions were answered, and informed consent                            was obtained. Prior Anticoagulants: The patient has                            taken no anticoagulant or antiplatelet agents. ASA                            Grade Assessment: II - A patient with mild systemic                            disease. After reviewing the risks and benefits,  the patient was deemed in satisfactory condition to                            undergo the procedure.                           After obtaining informed consent, the colonoscope                            was passed under direct vision. Throughout the                            procedure, the patient's blood pressure, pulse, and                             oxygen saturations were monitored continuously. The                            [id number]-[id number] ([id number]) scope was introduced through                            the anus and advanced to the the terminal ileum.                            The colonoscopy was technically difficult and                            complex due to the patient's oxygen desaturation.                            Successful completion of the procedure was aided by                            performing chin lift, administering oxygen and                            treating with ventilation. The quality of the bowel                            preparation was evaluated using the BBPS [facility]                            Bowel Preparation Scale) with scores of: Right                            Colon = 2 (minor amount of residual staining, small                            fragments of stool and/or opaque liquid, but mucosa                            seen well), Transverse Colon = 2 (minor amount of  residual staining, small fragments of stool and/or                            opaque liquid, but mucosa seen well) and Left Colon                            = 2 (minor amount of residual staining, small                            fragments of stool and/or opaque liquid, but mucosa                            seen well). The total BBPS score equals 6. The                            patient tolerated the procedure well. The terminal                            ileum was photographed. Scope In: 10:27:51 AM Scope Out: 10:51:00 AM Scope Withdrawal Time: 0 hours 20 minutes 37 seconds  Total Procedure Duration: 0 hours 23 minutes 9 seconds  Findings:      Hemorrhoids were found on perianal exam.      A few diverticula were found in the sigmoid colon.      Non-bleeding external and internal hemorrhoids were found during       retroflexion. The hemorrhoids were medium-sized. Impression:               - Hemorrhoids  found on perianal exam.                           - Diverticulosis in the sigmoid colon.                           - Non-bleeding external and internal hemorrhoids.                           - No specimens collected. Moderate Sedation:      Per Anesthesia Care Recommendation:           - Patient has a contact number available for                            emergencies. The signs and symptoms of potential                            delayed complications were discussed with the                            patient. Return to normal activities tomorrow.                            Written discharge instructions were provided to the                            patient.                           -  Resume previous diet.                           - Continue present medications.                           - Repeat colonoscopy in 5 years for screening                            purposes given fair prep .                           - Return to GI clinic.                           -Miralax BID and high fiber diet                           - Anusol supp x5 days                           -Follow up in Gi clinic for management of                            hemorrhoids Procedure Code(s):        --- Professional ---                           G2952, Colorectal cancer screening; colonoscopy on                            individual not meeting criteria for high risk Diagnosis Code(s):        --- Professional ---                           Z12.11, Encounter for screening for malignant                            neoplasm of colon                           K64.8, Other hemorrhoids                           K57.30, Diverticulosis of large intestine without                            perforation or abscess without bleeding CPT copyright 2022 American Medical Association. All rights reserved. The codes documented in this report are preliminary and upon coder review may  be revised to meet current compliance  requirements. Sanjuan Dame, MD Sanjuan Dame, MD 12/15/2023 10:56:55 AM This report has been signed electronically. Number of Addenda: 0

## 2023-12-15 NOTE — Anesthesia Postprocedure Evaluation (Signed)
Anesthesia Post Note  Patient: Susan Wheeler  Procedure(s) Performed: COLONOSCOPY WITH PROPOFOL ESOPHAGOGASTRODUODENOSCOPY (EGD) WITH PROPOFOL BIOPSY SAVORY DILATION  Patient location during evaluation: Endoscopy Anesthesia Type: General Level of consciousness: awake and alert Pain management: pain level controlled Vital Signs Assessment: post-procedure vital signs reviewed and stable Respiratory status: spontaneous breathing Cardiovascular status: stable Postop Assessment: no apparent nausea or vomiting Anesthetic complications: no Comments: Received nebulizer treatment, no SOB, sats 100%.  Patient discharged without problems.     No notable events documented.   Last Vitals:  Vitals:   12/15/23 1112 12/15/23 1118  BP: 97/65   Pulse: 85 87  Resp: 15 16  Temp:    SpO2: 100%     Last Pain:  Vitals:   12/15/23 1107  TempSrc:   PainSc: 0-No pain                 Laurella Tull

## 2023-12-16 ENCOUNTER — Encounter (HOSPITAL_COMMUNITY): Payer: Self-pay | Admitting: Gastroenterology

## 2023-12-16 LAB — SURGICAL PATHOLOGY

## 2023-12-17 ENCOUNTER — Encounter (INDEPENDENT_AMBULATORY_CARE_PROVIDER_SITE_OTHER): Payer: Self-pay | Admitting: *Deleted

## 2023-12-18 ENCOUNTER — Ambulatory Visit: Payer: Self-pay | Admitting: Neurology

## 2023-12-21 NOTE — Progress Notes (Signed)
I reviewed the pathology results. [name], can you send her a letter with the findings as described below please?  Repeat colonoscopy in 5 years  Thanks,  Vista Lawman, MD Gastroenterology and Hepatology [facility]  ---------------------------------------------------------------------------------------------  [facility] [address], [address], [place], [place] [address] Phone:  [phone]   12/21/23 Susan Wheeler, [place]   Dear Susan Wheeler,  I am writing to inform you that the biopsies taken during your recent endoscopic examination showed:  FINAL MICROSCOPIC DIAGNOSIS:   A. STOMACH, RANDOM, BIOPSY:  Antral mucosa with hyperemia.  Negative for Helicobacter pylori.   B. DISTAL, ESOPHAGEAL, BIOPSY:  Unremarkable squamous mucosa.  Negative for eosinophilic esophagitis.   C. PROXIMAL, ESOPHAGEAL, BIOPSY:  Unremarkable squamous mucosa.  Negative for eosinophilic esophagitis.    What does this mean?  Stomach biopsies negative for bacteria H. pylori and esophagus biopsy did not suggest eosinophilic esophagitis.  At this time continue to take omeprazole. It is recommended that your next colonoscopy be performed in 5 years.  Please continue to see [place] in the GI clinic  Also I value your feedback , so if you get a survey , please take the time to fill it out and thank you for choosing [facility]  Please call us at [phone] if you have persistent problems or have questions about your condition that have not been fully answered at this time.  Sincerely,  Vista Lawman, MD Gastroenterology and Hepatology

## 2023-12-22 ENCOUNTER — Encounter (INDEPENDENT_AMBULATORY_CARE_PROVIDER_SITE_OTHER): Payer: Self-pay | Admitting: *Deleted

## 2024-01-22 ENCOUNTER — Encounter: Payer: Self-pay | Admitting: Family Medicine

## 2024-01-22 ENCOUNTER — Ambulatory Visit: Payer: No Typology Code available for payment source | Admitting: Family Medicine

## 2024-01-22 VITALS — BP 127/84 | HR 65 | Ht 64.0 in | Wt 201.0 lb

## 2024-01-22 DIAGNOSIS — J4520 Mild intermittent asthma, uncomplicated: Secondary | ICD-10-CM

## 2024-01-22 DIAGNOSIS — D509 Iron deficiency anemia, unspecified: Secondary | ICD-10-CM

## 2024-01-22 DIAGNOSIS — Z6834 Body mass index (BMI) 34.0-34.9, adult: Secondary | ICD-10-CM

## 2024-01-22 DIAGNOSIS — Z8709 Personal history of other diseases of the respiratory system: Secondary | ICD-10-CM | POA: Diagnosis not present

## 2024-01-22 DIAGNOSIS — E6609 Other obesity due to excess calories: Secondary | ICD-10-CM

## 2024-01-22 DIAGNOSIS — Z23 Encounter for immunization: Secondary | ICD-10-CM | POA: Diagnosis not present

## 2024-01-22 DIAGNOSIS — G43709 Chronic migraine without aura, not intractable, without status migrainosus: Secondary | ICD-10-CM | POA: Diagnosis not present

## 2024-01-22 DIAGNOSIS — E66811 Obesity, class 1: Secondary | ICD-10-CM

## 2024-01-22 MED ORDER — MONTELUKAST SODIUM 10 MG PO TABS: 10.0000 mg | ORAL_TABLET | Freq: Every day | ORAL | 0 refills | Status: DC

## 2024-01-22 MED ORDER — TOPIRAMATE 25 MG PO TABS: 25.0000 mg | ORAL_TABLET | Freq: Every day | ORAL | 3 refills | Status: DC

## 2024-01-22 MED ORDER — ALBUTEROL SULFATE HFA 108 (90 BASE) MCG/ACT IN AERS: 2.0000 | INHALATION_SPRAY | Freq: Four times a day (QID) | RESPIRATORY_TRACT | 2 refills | Status: AC | PRN

## 2024-01-22 MED ORDER — FLUTICASONE-SALMETEROL 115-21 MCG/ACT IN AERO: 2.0000 | INHALATION_SPRAY | Freq: Two times a day (BID) | RESPIRATORY_TRACT | 12 refills | Status: AC

## 2024-01-22 MED ORDER — PAROXETINE HCL 20 MG PO TABS: 20.0000 mg | ORAL_TABLET | Freq: Every day | ORAL | 3 refills | Status: DC

## 2024-01-22 NOTE — Assessment & Plan Note (Signed)
 Controlled, continue Topamax 25 mg once daily  Advise on maintain a consistent sleep schedule, stay hydrated, and identify potential triggers such as certain foods, stress, or bright lights. Applying cold packs to the head, resting in a dark, quiet room, and practicing relaxation techniques like deep breathing can also help reduce symptoms.

## 2024-01-22 NOTE — Progress Notes (Signed)
 Established Patient Office Visit   Subjective  Patient ID: Susan Wheeler, female    DOB: Nov 26, 1975  Age: 48 y.o. MRN: [id number]  Chief Complaint  Patient presents with   Follow-up    Wt loss management, and lack of energy     She  has a past medical history of Anxiety, Asthma (01/23/2022), BV (bacterial vaginosis) (04/25/2015), Depressive disorder (01/23/2022), Dyspareunia (04/25/2015), GERD (gastroesophageal reflux disease), Vaginal discharge (04/25/2015), and Vaginal itching (04/25/2015).  HPI Patient presents to the clinic for follow up. For the details of today's visit, please refer to assessment and plan.   Review of Systems  Constitutional:  Negative for chills and fever.  Respiratory:  Negative for shortness of breath.   Cardiovascular:  Negative for chest pain.  Gastrointestinal:  Negative for abdominal pain.  Genitourinary:  Negative for dysuria.  Neurological:  Negative for dizziness and headaches.      Objective:     BP 127/84   Pulse 65   Ht 5\' 4"  (1.626 m)   Wt 201 lb (91.2 kg)   LMP 08/06/2015 (Approximate) Comment: hysterectomy  SpO2 96%   BMI 34.50 kg/m  BP Readings from Last 3 Encounters:  01/22/24 127/84  12/15/23 97/65  11/23/23 119/78      Physical Exam Vitals reviewed.  Constitutional:      General: She is not in acute distress.    Appearance: Normal appearance. She is not ill-appearing, toxic-appearing or diaphoretic.  HENT:     Head: Normocephalic.  Eyes:     General:        Right eye: No discharge.        Left eye: No discharge.     Conjunctiva/sclera: Conjunctivae normal.  Cardiovascular:     Rate and Rhythm: Normal rate.     Pulses: Normal pulses.     Heart sounds: Normal heart sounds.  Pulmonary:     Effort: Pulmonary effort is normal. No respiratory distress.     Breath sounds: Normal breath sounds.  Abdominal:     General: Bowel sounds are normal.     Palpations: Abdomen is soft.     Tenderness: There is no abdominal  tenderness. There is no right CVA tenderness, left CVA tenderness or guarding.  Skin:    General: Skin is warm and dry.     Capillary Refill: Capillary refill takes less than 2 seconds.  Neurological:     Mental Status: She is alert.     Coordination: Coordination normal.     Gait: Gait normal.  Psychiatric:        Mood and Affect: Mood normal.        Behavior: Behavior normal.      No results found for any visits on 01/22/24.  The 10-year ASCVD risk score (Arnett DK, et al., 2019) is: 0.6%    Assessment & Plan:  Iron deficiency anemia, unspecified iron deficiency anemia type -     Iron, TIBC and Ferritin Panel -     Vitamin B12  History of asthma -     Albuterol Sulfate HFA; Inhale 2 puffs into the lungs every 6 (six) hours as needed for wheezing or shortness of breath.  Dispense: 8 g; Refill: 2  Mild intermittent asthma without complication Assessment & Plan: Continue Advair inhaler 2 puffs twice daily, Singulair 10 mg at bedtime. Explained to patient it's important to use maintenance inhaler even when you have no symptoms. Albuterol inhaler PRN use only. Encouraged non pharmacological interventions such as avoiding  allergens, having SABA inhaler handy at all times. Asthma action plan reviewed and updated. Discussed signs and symptoms of respiratory distress and need to present to the ED. Patient/parent verbalizes understanding regarding plan of care and all questions answered.   Orders: -     Montelukast Sodium; Take 1 tablet (10 mg total) by mouth at bedtime.  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  Dispense: 30 tablet; Refill: 3  Class 1 obesity due to excess calories with serious comorbidity and body mass index (BMI) of 34.0 to 34.9 in adult Assessment & Plan: Patient requesting referral to medical weight loss Discussed Eat a Balanced Diet: Focus on  whole, nutrient-dense foods like lean proteins, vegetables, fruits, whole grains, and healthy fats while avoiding processed and sugary foods. Stay Active: Incorporate at least 30 minutes of moderate physical activity most days of the week, such as walking, jogging, or strength training. Hydrate and Rest: Drink plenty of water throughout the day and ensure you get 7-9 hours of quality sleep each night to support metabolism and recovery. Practice Portion Control: Use smaller plates, measure portions, and eat mindfully to avoid overeating and manage calorie intake effectively.   Orders: -     Amb Ref to Medical Weight Management  Encounter for immunization -     Pneumococcal conjugate vaccine 20-valent  Chronic migraine without aura without status migrainosus, not intractable Assessment & Plan: Controlled, continue Topamax 25 mg once daily  Advise on maintain a consistent sleep schedule, stay hydrated, and identify potential triggers such as certain foods, stress, or bright lights. Applying cold packs to the head, resting in a dark, quiet room, and practicing relaxation techniques like deep breathing can also help reduce symptoms.     Other orders -     Fluticasone-Salmeterol; Inhale 2 puffs into the lungs 2 (two) times daily.  Dispense: 1 each; Refill: 12 -     PARoxetine HCl; Take 1 tablet (20 mg total) by mouth daily.  Dispense: 30 tablet; Refill: 3    Return in about 6 months (around 07/24/2024), or if symptoms worsen or fail to improve, for Annual Physical.   Cruzita Lederer Newman Nip, FNP

## 2024-01-22 NOTE — Patient Instructions (Signed)

## 2024-01-22 NOTE — Assessment & Plan Note (Addendum)
 Continue Advair inhaler 2 puffs twice daily, Singulair 10 mg at bedtime. Explained to patient it's important to use maintenance inhaler even when you have no symptoms. Albuterol inhaler PRN use only. Encouraged non pharmacological interventions such as avoiding allergens, having SABA inhaler handy at all times. Asthma action plan reviewed and updated. Discussed signs and symptoms of respiratory distress and need to present to the ED. Patient/parent verbalizes understanding regarding plan of care and all questions answered.

## 2024-01-22 NOTE — Assessment & Plan Note (Signed)
 Patient requesting referral to medical weight loss Discussed Eat a Balanced Diet: Focus on whole, nutrient-dense foods like lean proteins, vegetables, fruits, whole grains, and healthy fats while avoiding processed and sugary foods. Stay Active: Incorporate at least 30 minutes of moderate physical activity most days of the week, such as walking, jogging, or strength training. Hydrate and Rest: Drink plenty of water throughout the day and ensure you get 7-9 hours of quality sleep each night to support metabolism and recovery. Practice Portion Control: Use smaller plates, measure portions, and eat mindfully to avoid overeating and manage calorie intake effectively.

## 2024-01-23 LAB — IRON,TIBC AND FERRITIN PANEL
Ferritin: 307 ng/mL — ABNORMAL HIGH (ref 15–150)
Iron Saturation: 33 % (ref 15–55)
Iron: 96 ug/dL (ref 27–159)
Total Iron Binding Capacity: 294 ug/dL (ref 250–450)
UIBC: 198 ug/dL (ref 131–425)

## 2024-01-23 LAB — VITAMIN B12: Vitamin B-12: 512 pg/mL (ref 232–1245)

## 2024-01-28 ENCOUNTER — Encounter (INDEPENDENT_AMBULATORY_CARE_PROVIDER_SITE_OTHER): Payer: Self-pay

## 2024-01-28 ENCOUNTER — Encounter: Payer: Self-pay | Admitting: Family Medicine

## 2024-02-08 DIAGNOSIS — Z0289 Encounter for other administrative examinations: Secondary | ICD-10-CM

## 2024-02-09 ENCOUNTER — Ambulatory Visit: Admitting: Family Medicine

## 2024-02-09 VITALS — BP 132/85 | HR 85 | Temp 98.2°F | Ht 64.5 in | Wt 197.0 lb

## 2024-02-09 DIAGNOSIS — E6609 Other obesity due to excess calories: Secondary | ICD-10-CM

## 2024-02-09 DIAGNOSIS — E66811 Obesity, class 1: Secondary | ICD-10-CM

## 2024-02-09 DIAGNOSIS — F419 Anxiety disorder, unspecified: Secondary | ICD-10-CM | POA: Diagnosis not present

## 2024-02-09 DIAGNOSIS — G43709 Chronic migraine without aura, not intractable, without status migrainosus: Secondary | ICD-10-CM

## 2024-02-09 DIAGNOSIS — Z6833 Body mass index (BMI) 33.0-33.9, adult: Secondary | ICD-10-CM

## 2024-02-09 NOTE — Progress Notes (Signed)
 Office: [phone]  /  Fax: [phone]   Initial Visit  Susan Wheeler was seen in clinic today to evaluate for obesity. She is interested in losing weight to improve overall health and reduce the risk of weight related complications. She presents today to review program treatment options, initial physical assessment, and evaluation.     She was referred by: PCP  When asked what else they would like to accomplish? She states: Improve energy levels and physical activity, Improve existing medical conditions, and Improve quality of life  Weight history:  she has been up and down with her weight for ~10 years.  She would like to get to 160 lb.  Weight has been fairly stable.  Works as a Lawyer- works days in ALF  When asked how has your [employer] affected you? She states: Contributed to medical problems, Contributed to orthopedic problems or mobility issues, and Having fatigue  Some associated conditions: None  Contributing factors: Family history of obesity, Consumption of processed foods, Moderate to high levels of stress, Reduced physical activity, and Eating patterns  Weight promoting medications identified: Psychotropic medications  Current nutrition plan: None  Current level of physical activity: NEAT  Current or previous pharmacotherapy: Phentermine and Topiramate  Response to medication: Lost weight initially but was unable to sustain weight loss Had BP/ HR elevation from phentermine  Past medical history includes:   Past Medical History:  Diagnosis Date   Anxiety    Asthma 01/23/2022   BV (bacterial vaginosis) 04/25/2015   Depressive disorder 01/23/2022   Dyspareunia 04/25/2015   GERD (gastroesophageal reflux disease)    Vaginal discharge 04/25/2015   Vaginal itching 04/25/2015     Objective:   BP 132/85   Pulse 85   Temp 98.2 F (36.8 C)   Ht 5' 4.5" (1.638 m)   Wt 197 lb (89.4 kg)   LMP 08/06/2015 (Approximate) Comment: hysterectomy  SpO2 100%   BMI 33.29  kg/m  She was weighed on the bioimpedance scale: Body mass index is 33.29 kg/m.  Peak Weight:203 , Body Fat%:38.2, Visceral Fat Rating:9, Weight trend over the last 12 months: Unchanged  General:  Alert, oriented and cooperative. Patient is in no acute distress.  Respiratory: Normal respiratory effort, no problems with respiration noted   Gait: able to ambulate independently  Mental Status: Normal mood and affect. Normal behavior. Normal judgment and thought content.   DIAGNOSTIC DATA REVIEWED:  BMET    Component Value Date/Time   NA 142 06/02/2023 0902   K 4.4 06/02/2023 0902   CL 106 06/02/2023 0902   CO2 23 06/02/2023 0902   GLUCOSE 87 06/02/2023 0902   GLUCOSE 87 11/25/2022 2101   BUN 11 06/02/2023 0902   CREATININE 0.95 06/02/2023 0902   CREATININE 0.79 02/12/2021 0815   CALCIUM 9.8 06/02/2023 0902   GFRNONAA >60 11/25/2022 2101   GFRNONAA 92 02/12/2021 0815   GFRAA 107 02/12/2021 0815   Lab Results  Component Value Date   HGBA1C 5.0 06/02/2023   HGBA1C 4.9 02/12/2021   No results found for: "INSULIN" CBC    Component Value Date/Time   WBC 8.9 06/02/2023 0902   WBC 8.1 11/25/2022 2101   RBC 4.97 06/02/2023 0902   RBC 4.83 11/25/2022 2101   HGB 13.9 06/02/2023 0902   HCT 42.1 06/02/2023 0902   PLT 273 06/02/2023 0902   MCV 85 06/02/2023 0902   MCH 28.0 06/02/2023 0902   MCH 28.6 11/25/2022 2101   MCHC 33.0 06/02/2023 0902  MCHC 33.6 11/25/2022 2101   RDW 12.7 06/02/2023 0902   Iron/TIBC/Ferritin/ %Sat    Component Value Date/Time   IRON 96 01/22/2024 0931   TIBC 294 01/22/2024 0931   FERRITIN 307 (H) 01/22/2024 0931   IRONPCTSAT 33 01/22/2024 0931   Lipid Panel     Component Value Date/Time   CHOL 146 06/02/2023 0902   TRIG 125 06/02/2023 0902   HDL 45 06/02/2023 0902   CHOLHDL 3.2 06/02/2023 0902   CHOLHDL 3.1 02/12/2021 0815   LDLCALC 79 06/02/2023 0902   LDLCALC 76 02/12/2021 0815   Hepatic Function Panel     Component Value Date/Time    PROT 6.9 02/12/2021 0815   ALBUMIN 3.9 08/10/2015 0922   AST 20 02/12/2021 0815   ALT 18 02/12/2021 0815   ALKPHOS 47 08/10/2015 0922   BILITOT 0.7 02/12/2021 0815   BILIDIR 0.1 11/01/2014 1340   IBILI 0.4 11/01/2014 1340      Component Value Date/Time   TSH 1.890 06/02/2023 0902     Assessment and Plan:   Anxiety Reports stable mood on Paxil 20 mg daily Notably, Paxil can be weight - promoting  Continue current meds per PCP.  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 Will work on eliminating or reducing the presence of highly palatable, calorie dense foods in the home. Will complete provided nutritional and psychosocial assessment questionnaire before the next appointment. Will be scheduled for indirect calorimetry to determine resting energy expenditure in a fasting state.  This will allow [place] to create a reduced calorie, high-protein meal plan to promote loss of fat mass while preserving muscle mass. Will think about ideas on how to incorporate physical activity into their daily routine. Counseled on the health benefits of losing 5%-15% of total body weight. Was counseled on nutritional approaches to weight loss and benefits of reducing processed foods and consuming plant-based foods and high quality protein as part of nutritional weight management. Was counseled on pharmacotherapy and role as an adjunct in weight management.   Obesity Education Performed Today:  She was weighed  on the bioimpedance scale and results were discussed and documented in the synopsis.  We discussed obesity as a disease and the importance of a more detailed evaluation of all the factors contributing to the disease.  We discussed the importance of long term lifestyle changes which include nutrition, exercise and behavioral modifications as well as the importance of customizing this to her specific health and social needs.  We discussed the benefits of reaching a healthier weight to alleviate the symptoms of existing conditions and reduce the risks of the biomechanical, metabolic and psychological effects of obesity.  Carie Caddy appears to be in the action stage of change and states they are ready to start intensive lifestyle modifications and behavioral modifications.  16 minutes was spent today on this visit including the above counseling, pre-visit chart review, and post-visit documentation.      Seymour Bars, D.O. DABFM, DABOM [facility] [address] [place], [place] [address] [phone]

## 2024-03-09 ENCOUNTER — Other Ambulatory Visit: Payer: Self-pay | Admitting: Family Medicine

## 2024-03-09 DIAGNOSIS — J4520 Mild intermittent asthma, uncomplicated: Secondary | ICD-10-CM

## 2024-03-22 ENCOUNTER — Ambulatory Visit (INDEPENDENT_AMBULATORY_CARE_PROVIDER_SITE_OTHER): Payer: No Typology Code available for payment source | Admitting: Gastroenterology

## 2024-03-22 ENCOUNTER — Encounter (INDEPENDENT_AMBULATORY_CARE_PROVIDER_SITE_OTHER): Payer: Self-pay | Admitting: Gastroenterology

## 2024-03-22 ENCOUNTER — Encounter: Payer: Self-pay | Admitting: Family Medicine

## 2024-03-22 ENCOUNTER — Ambulatory Visit (INDEPENDENT_AMBULATORY_CARE_PROVIDER_SITE_OTHER): Admitting: Family Medicine

## 2024-03-22 VITALS — BP 123/82 | HR 77 | Temp 98.0°F | Ht 64.5 in | Wt 195.0 lb

## 2024-03-22 VITALS — BP 111/78 | HR 80 | Temp 97.3°F | Ht 64.0 in | Wt 199.1 lb

## 2024-03-22 DIAGNOSIS — K641 Second degree hemorrhoids: Secondary | ICD-10-CM

## 2024-03-22 DIAGNOSIS — R5383 Other fatigue: Secondary | ICD-10-CM

## 2024-03-22 DIAGNOSIS — Z6832 Body mass index (BMI) 32.0-32.9, adult: Secondary | ICD-10-CM

## 2024-03-22 DIAGNOSIS — K219 Gastro-esophageal reflux disease without esophagitis: Secondary | ICD-10-CM

## 2024-03-22 DIAGNOSIS — E559 Vitamin D deficiency, unspecified: Secondary | ICD-10-CM | POA: Diagnosis not present

## 2024-03-22 DIAGNOSIS — K5900 Constipation, unspecified: Secondary | ICD-10-CM

## 2024-03-22 DIAGNOSIS — K649 Unspecified hemorrhoids: Secondary | ICD-10-CM | POA: Diagnosis not present

## 2024-03-22 DIAGNOSIS — G43709 Chronic migraine without aura, not intractable, without status migrainosus: Secondary | ICD-10-CM | POA: Diagnosis not present

## 2024-03-22 DIAGNOSIS — R0602 Shortness of breath: Secondary | ICD-10-CM | POA: Diagnosis not present

## 2024-03-22 DIAGNOSIS — F419 Anxiety disorder, unspecified: Secondary | ICD-10-CM

## 2024-03-22 DIAGNOSIS — E66811 Obesity, class 1: Secondary | ICD-10-CM

## 2024-03-22 NOTE — Progress Notes (Signed)
 At a Glance:  Vitals Temp: 98 F (36.7 C) BP: 123/82 Pulse Rate: 77 SpO2: 99 %   Anthropometric Measurements Height: 5' 4.5" (1.638 m) Weight: 195 lb (88.5 kg) BMI (Calculated): 32.97 Starting Weight: 195lb Peak Weight: 203lb   Body Composition  Body Fat %: 36.9 % Fat Mass (lbs): 72 lbs Muscle Mass (lbs): 117 lbs Total Body Water  (lbs): 85.2 lbs Visceral Fat Rating : 8   Other Clinical Data RMR: 1642 Fasting: Yes Labs: Yes Today's Visit #: 1 Starting Date: 03/22/24    EKG: Normal sinus rhythm, rate 77.  Indirect Calorimeter completed today shows a VO2 of 238 and a REE of 1642.  Her calculated basal metabolic rate is 1610 thus her basal metabolic rate is worse than expected.  Chief Complaint:  Obesity   Subjective:  Susan Wheeler (MR# [id number]) is a 48 y.o. female who presents for evaluation and treatment of obesity and related comorbidities.   Susan Wheeler is currently in the action stage of change and ready to dedicate time achieving and maintaining a healthier weight. Susan Wheeler is interested in becoming our patient and working on intensive lifestyle modifications including (but not limited to) diet and exercise for weight loss.  Susan Wheeler has been struggling with her weight. She has been unsuccessful in either losing weight, maintaining weight loss, or reaching her healthy weight goal. She works as a Lawyer, day shift in an ALF.  Married with grown children and 6 grandkids.  Husband is supportive but a junk food eater.  Craves sweets and eats out 6 x a week.  She started working out at the gym 1 month ago. She had some success on phentermine in the past but had chest pain.  Susan Wheeler's habits were reviewed today and are as follows: Her family eats meals together, her desired weight loss is 150 lb, she started gaining weight 5 years ago, up about 35 lb. she has significant food cravings issues, she snacks frequently in the evenings, she is frequently drinking liquids with  calories, she frequently makes poor food choices, she has problems with excessive hunger, she frequently eats larger portions than normal, and she struggles with emotional eating.   Other Fatigue Miarose denies daytime somnolence and denies waking up still tired. Patient has a history of symptoms of morning fatigue. [name] generally gets 6 or 7 hours of sleep per night, and states that she has nightime awakenings. Snoring is not present. Apneic episodes are not present. Epworth Sleepiness Score is 9.   Shortness of Breath [name] notes increasing shortness of breath with exercising and seems to be worsening over time with weight gain. She notes getting out of breath sooner with activity than she used to. This has gotten worse recently. Anjanae denies shortness of breath at rest or orthopnea.   Depression Screen Susan Wheeler's Food and Mood (modified PHQ-9) score was 12.     01/22/2024    8:57 AM  Depression screen PHQ 2/9  Decreased Interest 0  Down, Depressed, Hopeless 0  PHQ - 2 Score 0  Altered sleeping 1  Tired, decreased energy 1  Change in appetite 1  Feeling bad or failure about yourself  0  Trouble concentrating 0  Moving slowly or fidgety/restless 0  Suicidal thoughts 0  PHQ-9 Score 3  Difficult doing work/chores Not difficult at all     Assessment and Plan:   Other Fatigue Susan Wheeler does feel that her weight is causing her energy to be lower than it should be. Fatigue may be  related to obesity, depression or many other causes. Labs will be ordered, and in the meanwhile, [name] will focus on self care including making healthy food choices, increasing physical activity and focusing on stress reduction.  Shortness of Breath Susan Wheeler does feel that she gets out of breath more easily that she used to when she exercises. Susan Wheeler's shortness of breath appears to be obesity related and exercise induced. She has agreed to work on weight loss and gradually increase exercise to treat her  exercise induced shortness of breath. Will continue to monitor closely.  Susan Wheeler had a positive depression screening. Depression is commonly associated with obesity and often results in emotional eating behaviors. We will monitor this closely and work on CBT to help improve the non-hunger eating patterns. Referral to Psychology may be required if no improvement is seen as she continues in our clinic.    Problem List Items Addressed This Visit     GERD (gastroesophageal reflux disease) Using famotidine 20 mg once daily as needed and omeprazole  40 mg twice daily.  Denies breakthrough reflux.  Look for improvements with weight reduction.  Avoid late-night eating.    Anxiety She is currently on paroxetine  20 mg once daily by her PCP.  She denies seeing much weight gain with start of paroxetine .  Admits to emotional eating tendencies.  Continue current medications.  Keep junk food trigger foods out of the house.  Work on eating on a schedule and keeping stress levels low.   Other Visit Diagnoses       SOBOE (shortness of breath on exertion)    -  Primary     Other fatigue       Relevant Orders   EKG 12-Lead (Completed)   VITAMIN D  25 Hydroxy (Vit-D Deficiency, Fractures)   TSH   T4, free   T3   Insulin, random   Hemoglobin A1c   Folate   Comprehensive metabolic panel with GFR   CBC with Differential/Platelet     Class 1 obesity due to excess calories with body mass index (BMI) of 32.0 to 32.9 in adult, unspecified whether serious comorbidity present         Chronic migraine w/o aura, not intractable, w/o stat migr     She is currently on topiramate  25 mg once daily for migraine prevention.  She has not seen much weight reduction but has seen a decrease in headache frequency.  She is tolerating topiramate  well without adverse side effects. Continue current plan of care.  Consider increasing dose to assist with weight reduction.     Vitamin D  deficiency     Last vitamin D  Lab Results   Component Value Date   VD25OH 38.3 06/02/2023  She has a history of vitamin D  deficiency.  Recheck vitamin D  level today with a target goal over 50.  She is currently not on prescription vitamin D  supplement.        Bexley is currently in the action stage of change and her goal is to continue with weight loss efforts. I recommend Toiya begin the structured treatment plan as follows:  She has agreed to Category 3 Plan  Exercise goals: All adults should avoid inactivity. Some activity is better than none, and adults who participate in any amount of physical activity, gain some health benefits.  Behavioral modification strategies:increasing lean protein intake, increasing vegetables, increase H2O intake, decrease liquid calories, increase high fiber foods, decreasing eating out, no skipping meals, meal planning and cooking strategies, keeping healthy foods in  the home, better snacking choices, family/coworker sabotage, and decrease junk food   She was informed of the importance of frequent follow-up visits to maximize her success with intensive lifestyle modifications for her multiple health conditions. She was informed we would discuss her lab results at her next visit unless there is a critical issue that needs to be addressed sooner. Susan Wheeler agreed to keep her next visit at the agreed upon time to discuss these results.  Objective:  General: Cooperative, alert, well developed, in no acute distress. HEENT: Conjunctivae and lids unremarkable. Cardiovascular: Regular rhythm.  Lungs: Normal work of breathing. Neurologic: No focal deficits.   Lab Results  Component Value Date   CREATININE 0.95 06/02/2023   BUN 11 06/02/2023   NA 142 06/02/2023   K 4.4 06/02/2023   CL 106 06/02/2023   CO2 23 06/02/2023   Lab Results  Component Value Date   ALT 18 02/12/2021   AST 20 02/12/2021   ALKPHOS 47 08/10/2015   BILITOT 0.7 02/12/2021   Lab Results  Component Value Date   HGBA1C 5.0  06/02/2023   HGBA1C 4.9 02/12/2021   No results found for: "INSULIN" Lab Results  Component Value Date   TSH 1.890 06/02/2023   Lab Results  Component Value Date   CHOL 146 06/02/2023   HDL 45 06/02/2023   LDLCALC 79 06/02/2023   TRIG 125 06/02/2023   CHOLHDL 3.2 06/02/2023   Lab Results  Component Value Date   WBC 8.9 06/02/2023   HGB 13.9 06/02/2023   HCT 42.1 06/02/2023   MCV 85 06/02/2023   PLT 273 06/02/2023   Lab Results  Component Value Date   IRON 96 01/22/2024   TIBC 294 01/22/2024   FERRITIN 307 (H) 01/22/2024    Attestation Statements:  Reviewed by clinician on day of visit: allergies, medications, problem list, medical history, surgical history, family history, social history, and previous encounter notes.  Time spent on visit including pre-visit chart review and post-visit charting and face- to face care including nutritional counseling, review of EKG, interpretation of body composition scale and indirect calorimetry and nutrition prescription  was 41 minutes.   Micky Albee, D.O. DABFM, DABOM [facility] [address] [place], [place] [address] [phone]

## 2024-03-22 NOTE — Patient Instructions (Signed)
-  continue omeprazole  40mg  twice daily -be mindful of greasy, spicy, fried, citrus foods, caffeine, carbonated drinks, chocolate and alcohol as these can increase reflux symptoms -Stay upright 2-3 hours after eating, prior to lying down and avoid eating late in the evenings. -continue with benefiber  -continue good water  intake, aim for 64 oz per day  -increase miralax  to 1 capful Twice daily   We will schedule you for hemorrhoid banding in next available slot  It was a pleasure to see you today. I want to create trusting relationships with patients and provide genuine, compassionate, and quality care. I truly value your feedback! please be on the lookout for a survey regarding your visit with me today. I appreciate your input about our visit and your time in completing this!    Ichael Pullara L. Jhony Antrim, MSN, APRN, AGNP-C Adult-Gerontology Nurse Practitioner [facility] at [facility]

## 2024-03-22 NOTE — Progress Notes (Signed)
 Referring Provider: Del Orbe Polanco, Ilian* Primary Care Physician:  Rosanna Comment, FNP Primary GI Physician: Dr. Alita Irwin   Chief Complaint  Patient presents with   Follow-up    Patient here today for a follow up on Gerd, which patient says is controlled by omeprazole  40 mg bid and famotidine prn.    HPI:   Susan Wheeler is a 48 y.o. female with past medical history of anxiety, asthma, depression, GERD   Patient presenting today for:  Follow up of GERD, dysphagia, constipation, hemorrhoids   Last seen January, at that time having issues with hemorrhoids, rectal bleeding, constipation. Taking miraalx PRN. Dysphagia for the past few years with foods. Taking protonix  40mg  BID with breakthrough, not taking pepcid   Recommended stop protonix , start omeprazole  40mg  daily, EGD/Colonoscopy, anusol  TID, mrialax/benefiber regimen, consider hemorrhoid banding after colonoscopy  Present:  Feeling okay today. GERD doing well on omeprazole  40mg  BID, uses famotidine PRN for occasional breakthrough. Dysphagia has resolved.  She continues to have issues with hemorrhoids. No bleeding. Not currently using anything. Did anusol  and suppositories which helped temporarily but still with recurrence after stopping noting she can feel the hemorrhoids and has some discomfort.  Has occasional constipation/straining, using miralax  1 capful and benefiber. Had been having a BM daily to 1-2 times per day but now back to every couple of day. She felt higher dose of miralax  did better in the beginning. Trying to drink a good amount of water .   Last Colonoscopy:12/2023 - Hemorrhoids found on perianal exam.                           - Diverticulosis in the sigmoid colon.                           - Non-bleeding external and internal hemorrhoids.                           - No specimens collected.  Last Endoscopy: 12/2023 - Esophageal mucosal changes suspicious for                            eosinophilic  esophagitis. Dilated.                           - 2 cm hiatal hernia.                           - Erythematous mucosa in the antrum. Biopsied.                           - Normal duodenal bulb and second portion of the                            duodenum.                           - Biopsies were taken with a cold forceps for                            evaluation of eosinophilic esophagitis.  STOMACH, RANDOM, BIOPSY:  Antral mucosa  with hyperemia.  Negative for Helicobacter pylori.   B. DISTAL, ESOPHAGEAL, BIOPSY:  Unremarkable squamous mucosa.  Negative for eosinophilic esophagitis.   C. PROXIMAL, ESOPHAGEAL, BIOPSY:  Unremarkable squamous mucosa.  Negative for eosinophilic esophagitis.   Repeat TCS 5 years   Past Medical History:  Diagnosis Date   Anxiety    Asthma 01/23/2022   BV (bacterial vaginosis) 04/25/2015   Constipation    Depressive disorder 01/23/2022   Dyspareunia 04/25/2015   Edema    GERD (gastroesophageal reflux disease)    Joint pain    Lactose intolerance    SOB (shortness of breath)    Swallowing difficulty    Vaginal discharge 04/25/2015   Vaginal itching 04/25/2015    Past Surgical History:  Procedure Laterality Date   BIOPSY  12/15/2023   Procedure: BIOPSY;  Surgeon: Hargis Lias, MD;  [facility];  Service: Endoscopy;;   CHOLECYSTECTOMY N/A 11/06/2014   Procedure: LAPAROSCOPIC CHOLECYSTECTOMY;  Surgeon: Beau Bound, MD;  Location: [facility];  Service: General;  Laterality: N/A;   COLONOSCOPY WITH PROPOFOL  N/A 12/15/2023   Procedure: COLONOSCOPY WITH PROPOFOL ;  Surgeon: Hargis Lias, MD;  Location: [facility];  Service: Endoscopy;  Laterality: N/A;  9:45am;asa 2   ESOPHAGOGASTRODUODENOSCOPY (EGD) WITH PROPOFOL  N/A 12/15/2023   Procedure: ESOPHAGOGASTRODUODENOSCOPY (EGD) WITH PROPOFOL ;  Surgeon: Hargis Lias, MD;  Location: [facility];  Service: Endoscopy;  Laterality: N/A;  9:45am;asa 2   KNEE ARTHROSCOPY Left     MANDIBLE SURGERY     SAVORY DILATION  12/15/2023   Procedure: SAVORY DILATION;  Surgeon: Hargis Lias, MD;  Location: [facility];  Service: Endoscopy;;   TUBAL LIGATION     VAGINAL HYSTERECTOMY N/A 08/15/2015   Procedure: HYSTERECTOMY VAGINAL;  Surgeon: Wendelyn Halter, MD;  Location: [facility];  Service: Gynecology;  Laterality: N/A;    Current Outpatient Medications  Medication Sig Dispense Refill   acetaminophen  (TYLENOL ) 500 MG tablet Take 1,000 mg by mouth every 6 (six) hours as needed.     albuterol  (VENTOLIN  HFA) 108 (90 Base) MCG/ACT inhaler Inhale 2 puffs into the lungs every 6 (six) hours as needed for wheezing or shortness of breath. 8 g 2   famotidine (PEPCID) 20 MG tablet Take 20 mg by mouth daily as needed.     fluticasone -salmeterol (ADVAIR HFA) 115-21 MCG/ACT inhaler Inhale 2 puffs into the lungs 2 (two) times daily. 1 each 12   montelukast  (SINGULAIR ) 10 MG tablet TAKE ONE TABLET BY MOUTH AT BEDTIME 30 tablet 0   Multiple Vitamin (MULTIVITAMIN ADULT PO) Take 1 tablet by mouth daily.     omeprazole  (PRILOSEC) 40 MG capsule Take 1 capsule (40 mg total) by mouth in the morning and at bedtime. 60 capsule 1   PARoxetine  (PAXIL ) 20 MG tablet Take 1 tablet (20 mg total) by mouth daily. 30 tablet 3   topiramate  (TOPAMAX ) 25 MG tablet Take 1 tablet (25 mg total) by mouth daily. 30 tablet 3   No current facility-administered medications for this visit.    Allergies as of 03/22/2024 - Review Complete 03/22/2024  Allergen Reaction Noted   Penicillins Hives and Nausea And Vomiting 10/23/2014   Codeine Hives 08/16/2014   Dilaudid  [hydromorphone ] Nausea And Vomiting 09/26/2015    Social History   Socioeconomic History   Marital status: Married    Spouse name: Not on file   Number of children: Not on file   Years of education: Not on file   Highest  education level: Some college, no degree  Occupational History   Not on file  Tobacco Use   Smoking status: Never   Smokeless  tobacco: Never  Vaping Use   Vaping status: Never Used  Substance and Sexual Activity   Alcohol use: No   Drug use: No   Sexual activity: Not Currently    Birth control/protection: Surgical    Comment: tubal  Other Topics Concern   Not on file  Social History Narrative   Not on file   Social Drivers of Health   Financial Resource Strain: Low Risk  (11/30/2023)   Overall Financial Resource Strain (CARDIA)    Difficulty of Paying Living Expenses: Not very hard  Food Insecurity: No Food Insecurity (11/30/2023)   Hunger Vital Sign    Worried About Running Out of Food in the Last Year: Never true    Ran Out of Food in the Last Year: Never true  Transportation Needs: No Transportation Needs (11/30/2023)   PRAPARE - Administrator, Civil Service (Medical): No    Lack of Transportation (Non-Medical): No  Physical Activity: Unknown (11/30/2023)   Exercise Vital Sign    Days of Exercise per Week: 0 days    Minutes of Exercise per Session: Not on file  Stress: Stress Concern Present (11/30/2023)   [employer] of Occupational Health - Occupational Stress Questionnaire    Feeling of Stress : To some extent  Social Connections: Socially Integrated (11/30/2023)   Social Connection and Isolation Panel [NHANES]    Frequency of Communication with Friends and Family: More than three times a week    Frequency of Social Gatherings with Friends and Family: Once a week    Attends Religious Services: More than 4 times per year    Active Member of [employer] or Organizations: Yes    Attends Engineer, structural: More than 4 times per year    Marital Status: Married    Review of systems General: negative for malaise, night sweats, fever, chills, weight loss Neck: Negative for lumps, goiter, pain and significant neck swelling Resp: Negative for cough, wheezing, dyspnea at rest CV: Negative for chest pain, leg swelling, palpitations, orthopnea GI: denies melena, hematochezia,  nausea, vomiting, diarrhea, dysphagia, odyonophagia, early satiety or unintentional weight loss. +constipation +hemorrhoids  The remainder of the review of systems is noncontributory.  Physical Exam: BP 111/78 (BP Location: Left Arm, Patient Position: Sitting, Cuff Size: Normal)   Pulse 80   Temp (!) 97.3 F (36.3 C) (Temporal)   Ht 5\' 4"  (1.626 m)   Wt 199 lb 1.6 oz (90.3 kg)   LMP 08/06/2015 (Approximate) Comment: hysterectomy  BMI 34.18 kg/m  General:   Alert and oriented. No distress noted. Pleasant and cooperative.  Head:  Normocephalic and atraumatic. Eyes:  Conjuctiva clear without scleral icterus. Mouth:  Oral mucosa pink and moist. Good dentition. No lesions. Heart: Normal rate and rhythm, s1 and s2 heart sounds present.  Lungs: Clear lung sounds in all lobes. Respirations equal and unlabored. Abdomen:  +BS, soft, non-tender and non-distended. No rebound or guarding. No HSM or masses noted. Neurologic:  Alert and  oriented x4 Psych:  Alert and cooperative. Normal mood and affect.  Invalid input(s): "6 MONTHS"   ASSESSMENT: RAMAYA GUILE is a 48 y.o. female presenting today for follow up of Constipation, hemorrhoids, GERD, dysphagia   GERD/Dysphagia:doing well on nexium 40mg  BID. Recent EGD with esophageal dilation which resolved her Dysphagia. Will continue with current PPI regimen, good  reflux precautions   Constipation: somewhat improved, currently on miralax  1 capful daily and benefiber. Trying to drink plenty of water . Felt better on 2 capfuls of miralax  daily therefore recommend increasing back to BID dosing, continue benefiber and good water  intake/high fiber diet.   Hemorrhoids: recent colonoscopy as above with hemorrhoids. Continues to have issues. Some temporary relief with topicals though hemorrhoids have persisted. She continues to have issues and is interested in pursuing hemorrhoid banding. We will get her scheduled for this in next available slot.    PLAN:   -continue omeprazole  40mg  BID -continue famotidine PRN for breakthrough -good reflux precautions -continue with benefiber  -continue good water  intake -increase miralax  to BID -schedule hemorrhoid banding in next open slot   All questions were answered, patient verbalized understanding and is in agreement with plan as outlined above.    Rayshawn Visconti L. Shameca Landen, MSN, APRN, AGNP-C Adult-Gerontology Nurse Practitioner [facility]

## 2024-03-23 ENCOUNTER — Ambulatory Visit: Payer: Self-pay | Admitting: Family Medicine

## 2024-03-23 LAB — COMPREHENSIVE METABOLIC PANEL WITH GFR
ALT: 30 IU/L (ref 0–32)
AST: 31 IU/L (ref 0–40)
Albumin: 4.3 g/dL (ref 3.9–4.9)
Alkaline Phosphatase: 78 IU/L (ref 44–121)
BUN/Creatinine Ratio: 13 (ref 9–23)
BUN: 13 mg/dL (ref 6–24)
Bilirubin Total: 0.4 mg/dL (ref 0.0–1.2)
CO2: 20 mmol/L (ref 20–29)
Calcium: 9.8 mg/dL (ref 8.7–10.2)
Chloride: 103 mmol/L (ref 96–106)
Creatinine, Ser: 1.04 mg/dL — ABNORMAL HIGH (ref 0.57–1.00)
Globulin, Total: 2.5 g/dL (ref 1.5–4.5)
Glucose: 93 mg/dL (ref 70–99)
Potassium: 4.3 mmol/L (ref 3.5–5.2)
Sodium: 139 mmol/L (ref 134–144)
Total Protein: 6.8 g/dL (ref 6.0–8.5)
eGFR: 68 mL/min/{1.73_m2} (ref >59)

## 2024-03-23 LAB — T3: T3, Total: 133 ng/dL (ref 71–180)

## 2024-03-23 LAB — HEMOGLOBIN A1C
Est. average glucose Bld gHb Est-mCnc: 100 mg/dL
Hgb A1c MFr Bld: 5.1 % (ref 4.8–5.6)

## 2024-03-23 LAB — TSH: TSH: 2.96 u[IU]/mL (ref 0.450–4.500)

## 2024-03-23 LAB — CBC WITH DIFFERENTIAL/PLATELET
Basophils Absolute: 0.1 10*3/uL (ref 0.0–0.2)
Basos: 1 %
EOS (ABSOLUTE): 0.3 10*3/uL (ref 0.0–0.4)
Eos: 4 %
Hematocrit: 43.1 % (ref 34.0–46.6)
Hemoglobin: 14 g/dL (ref 11.1–15.9)
Immature Grans (Abs): 0 10*3/uL (ref 0.0–0.1)
Immature Granulocytes: 0 %
Lymphocytes Absolute: 2.1 10*3/uL (ref 0.7–3.1)
Lymphs: 32 %
MCH: 28.2 pg (ref 26.6–33.0)
MCHC: 32.5 g/dL (ref 31.5–35.7)
MCV: 87 fL (ref 79–97)
Monocytes Absolute: 0.5 10*3/uL (ref 0.1–0.9)
Monocytes: 8 %
Neutrophils Absolute: 3.6 10*3/uL (ref 1.4–7.0)
Neutrophils: 55 %
Platelets: 310 10*3/uL (ref 150–450)
RBC: 4.96 x10E6/uL (ref 3.77–5.28)
RDW: 12.7 % (ref 11.7–15.4)
WBC: 6.6 10*3/uL (ref 3.4–10.8)

## 2024-03-23 LAB — INSULIN, RANDOM: INSULIN: 13.3 u[IU]/mL (ref 2.6–24.9)

## 2024-03-23 LAB — VITAMIN D 25 HYDROXY (VIT D DEFICIENCY, FRACTURES): Vit D, 25-Hydroxy: 37.3 ng/mL (ref 30.0–100.0)

## 2024-03-23 LAB — T4, FREE: Free T4: 0.89 ng/dL (ref 0.82–1.77)

## 2024-03-23 LAB — FOLATE: Folate: 13.7 ng/mL (ref >3.0)

## 2024-04-05 ENCOUNTER — Encounter: Payer: Self-pay | Admitting: Family Medicine

## 2024-04-05 ENCOUNTER — Ambulatory Visit (INDEPENDENT_AMBULATORY_CARE_PROVIDER_SITE_OTHER): Admitting: Gastroenterology

## 2024-04-05 ENCOUNTER — Ambulatory Visit: Admitting: Family Medicine

## 2024-04-05 ENCOUNTER — Encounter (INDEPENDENT_AMBULATORY_CARE_PROVIDER_SITE_OTHER): Payer: Self-pay | Admitting: Gastroenterology

## 2024-04-05 VITALS — BP 111/75 | HR 71 | Temp 98.4°F | Ht 64.0 in | Wt 193.4 lb

## 2024-04-05 VITALS — BP 116/74 | HR 63 | Temp 98.2°F | Ht 64.5 in | Wt 189.0 lb

## 2024-04-05 DIAGNOSIS — G43709 Chronic migraine without aura, not intractable, without status migrainosus: Secondary | ICD-10-CM

## 2024-04-05 DIAGNOSIS — E559 Vitamin D deficiency, unspecified: Secondary | ICD-10-CM | POA: Diagnosis not present

## 2024-04-05 DIAGNOSIS — Z6831 Body mass index (BMI) 31.0-31.9, adult: Secondary | ICD-10-CM

## 2024-04-05 DIAGNOSIS — K219 Gastro-esophageal reflux disease without esophagitis: Secondary | ICD-10-CM | POA: Diagnosis not present

## 2024-04-05 DIAGNOSIS — F419 Anxiety disorder, unspecified: Secondary | ICD-10-CM

## 2024-04-05 DIAGNOSIS — K641 Second degree hemorrhoids: Secondary | ICD-10-CM

## 2024-04-05 DIAGNOSIS — E66811 Obesity, class 1: Secondary | ICD-10-CM

## 2024-04-05 NOTE — Progress Notes (Signed)
 Office: [phone]  /  Fax: [phone]  WEIGHT SUMMARY AND BIOMETRICS  Starting Date: 03/22/24  Starting Weight: 195lb   Weight Lost Since Last Visit: 6lb   Vitals Temp: 98.2 F (36.8 C) BP: 116/74 Pulse Rate: 63 SpO2: 99 %   Body Composition  Body Fat %: 35.6 % Fat Mass (lbs): 67.6 lbs Muscle Mass (lbs): 116 lbs Total Body Water  (lbs): 86.8 lbs Visceral Fat Rating : 8    HPI  Chief Complaint: OBESITY  Susan Wheeler is here to discuss her progress with her obesity treatment plan. She is on the the Category 3 Plan and states she is following her eating plan approximately 100 % of the time. She states she is exercising 30-45 minutes 3-4 times per week.  Interval History:  Since last office visit she is down 6 lb Down 1 pound of muscle mass and down 4.4 pounds of body fat since last visit She has times that she still feels hungry eating on plan She is not eating all of her snacks She is liking the Built Puff bars She is working on planning out dinners without a lot of support from her husband She feels adequately full after dinner Nighttime hunger is fairly well controlled She is trying to work on behavior changes at time She has cut out SSBs  She is doing cardio and weight training at the gym 3-4 x a week  Pharmacotherapy: None  PHYSICAL EXAM:  Blood pressure 116/74, pulse 63, temperature 98.2 F (36.8 C), height 5' 4.5" (1.638 m), weight 189 lb (85.7 kg), last menstrual period 08/06/2015, SpO2 99%. Body mass index is 31.94 kg/m.  General: She is overweight, cooperative, alert, well developed, and in no acute distress. PSYCH: Has normal mood, affect and thought process.   Lungs: Normal breathing effort, no conversational dyspnea.   ASSESSMENT AND PLAN  TREATMENT PLAN FOR OBESITY:  Recommended Dietary Goals  Susan Wheeler is currently in the action stage of change. As such, her goal is to continue weight management plan. She has agreed to the Category 3  Plan. - She may expand fruit to 2 servings per day, any fresh or frozen - She has the option to add 1 high-fiber carb to dinner, options reviewed on after visit summary  Behavioral Intervention  We discussed the following Behavioral Modification Strategies today: increasing lean protein intake to established goals, increasing lower glycemic fruits, increasing fiber rich foods, avoiding skipping meals, increasing water  intake , keeping healthy foods at home, identifying sources and decreasing liquid calories, decreasing eating out or consumption of processed foods, and making healthy choices when eating convenient foods, practice mindfulness eating and understand the difference between hunger signals and cravings, avoiding temptations and identifying enticing environmental cues, continue to practice mindfulness when eating, planning for success, and continue to work on maintaining a reduced calorie state, getting the recommended amount of protein, incorporating whole foods, making healthy choices, staying well hydrated and practicing mindfulness when eating.. - Uses snacks on plan as listed, avoid skipping -Continue to work on behavior change in the evening, avoiding boredom or habit snacking  Additional resources provided today: NA  Recommended Physical Activity Goals  Susan Wheeler has been advised to work up to 150 minutes of moderate intensity aerobic activity a week and strengthening exercises 2-3 times per week for cardiovascular health, weight loss maintenance and preservation of muscle mass.   She has agreed to Work on scheduling and tracking physical activity.  Great job adding in gym workouts 3-4 times a week! -  Continue to work on finding the best time for workouts, early morning or after work  Pharmacotherapy changes for the treatment of obesity: None  ASSOCIATED CONDITIONS ADDRESSED TODAY  Vitamin D  insufficiency Last vitamin D  Lab Results  Component Value Date   VD25OH 37.3  03/22/2024  Reviewed labs with patient.  Her vitamin D  is not at goal over 50.  She has some complaints of fatigue and is currently not on a vitamin D  supplement.  We discussed the importance of vitamin D  for energy level, bone health and immune function.  Recommended beginning vitamin D3 at 2000 IU once daily.  Repeat lab in 6 months  Class 1 obesity due to excess calories with body mass index (BMI) of 31.0 to 31.9 in adult, unspecified whether serious comorbidity present  Chronic migraine w/o aura, not intractable, w/o stat migr She has seen a reduction in migraine frequency using topiramate  25 mg once daily by her PCP.  She denies having brain fog or paresthesias as side effects.  Is not at risk for pregnancy.  Consider increasing dosage of topiramate  to also help with weight loss if needed.  Gastroesophageal reflux disease, unspecified whether esophagitis present Unchanged.  She is currently on famotidine 20 mg as needed and omeprazole  40 mg twice daily for GERD.  This has been prescribed by Dr. Alita Irwin.  Look for improvements in GERD with dietary change and weight reduction.  Avoid late-night eating and high acid foods.  Anxiety Stable.  She is currently on paroxetine  20 mg once daily for anxiety.  Notably, this medication may be weight promoting.  Started PCP about potentially changing this in the future to something more weight friendly.  Continue to work on self-care, stress reduction and improving sleep at night.     She was informed of the importance of frequent follow up visits to maximize her success with intensive lifestyle modifications for her multiple health conditions.   ATTESTASTION STATEMENTS:  Reviewed by clinician on day of visit: allergies, medications, problem list, medical history, surgical history, family history, social history, and previous encounter notes pertinent to obesity diagnosis.   I have personally spent 30 minutes total time today in preparation, patient  care, nutritional counseling and education,  and documentation for this visit, including the following: review of most recent clinical lab tests, prescribing medications/ refilling medications, reviewing medical assistant documentation, review and interpretation of bioimpedence results.     Micky Albee, D.O. DABFM, DABOM [facility] [address] [place], [place] [address] [phone]

## 2024-04-05 NOTE — Patient Instructions (Signed)
Continue to avoid straining.   Limit toilet time to 2-3 minutes at the most.   Avoid constipation. Take 2 tablespoons of natural wheat bran, natural oat bran, flax, Benefiber or any over the counter fiber supplement and increase your water intake to 7-8 glasses daily.  Occasionally, you may have more bleeding than usual after the banding procedure. This is often from the untreated hemorrhoids rather than the treated one. Don't be concerned if there is a tablespoon or so of blood. If there is more blood than this, lie flat with your bottom higher than your head and apply an ice pack to the area. If the bleeding does not stop within a half an hour or if you feel faint, have severe pain, chills, fever or difficulty passing urine (very rare) or other problems, you should call us at [phone] or report to the nearest emergency room. Please call me with any concerns!  The procedure you have had should have been relatively painless since the banding of the area involved does not have nerve endings and there is no pain sensation. The rubber band cuts off the blood supply to the hemorrhoid and the band may fall off as soon as 48 hours after the banding (the band may occasionally be seen in the toilet bowl following a bowel movement). You may notice a temporary feeling of fullness in the rectum which should respond adequately to plain Tylenol or Motrin.  I will see you back in follow-up and/or for additional banding.  Huzaifa Viney L. Jeanmarie Hubert, MSN, APRN, AGNP-C Adult-Gerontology Nurse Practitioner [facility]

## 2024-04-05 NOTE — Patient Instructions (Signed)
 Keep up the good work with gym workouts!!  Continue a Multivitamin daily ADD on vitamin D  2,000 international units  daily  Hydrate well with water  -- ~90 oz/ day  You can expand fruit to 2 servings per day (any frozen or fresh fruits) You have the option to add a carb serving to dinner: - 1 serving of rice  - 1 serving of Barilla protein pasta - 1 serving of beans or corn - 1 low carb tortilla - 1 small white or sweet potato (( Stay off of bread/ buns/ pizza/ fries/ chips ))  Remember to use your snack calories on plan

## 2024-04-05 NOTE — Progress Notes (Signed)
    [facility] BANDING PROCEDURE NOTE  Susan Wheeler is a 48 y.o. female presenting today for consideration of hemorrhoid banding. Last colonoscopy 12/2023 with hemorrhoids, diverticulosis  The patient presents with symptomatic grade IV hemorrhoids, unresponsive to maximal medical therapy, requesting rubber band ligation of Susan Wheeler hemorrhoidal disease. All risks, benefits, and alternative forms of therapy were described and informed consent was obtained.  Patient denies any rectal bleeding, using miralax  and benefiber for constipation which is mostly well controlled. Some discomfort of hemorrhoids but mostly bothered by feeling hemorrhoids protrude out.  In the left lateral decubitus position, anoscopic examination revealed grade II hemorrhoids in the Right posterior position (s).  The decision was made to band the Right Posterior internal hemorrhoid, and the [facility] O'Regan System was used to perform band ligation without complication. Digital anorectal examination was then performed to assure proper positioning of the band, and to adjust the banded tissue as required. The patient was discharged home without pain or other issues. Dietary and behavioral recommendations were given, along with follow-up instructions. The patient will return in several weeks for followup and possible additional banding as required.  No complications were encountered and the patient tolerated the procedure well.   Kendyn Zaman L. Saralynn Langhorst, MSN, APRN, AGNP-C Adult-Gerontology Nurse Practitioner [facility] at [facility]

## 2024-04-19 ENCOUNTER — Ambulatory Visit (INDEPENDENT_AMBULATORY_CARE_PROVIDER_SITE_OTHER): Admitting: Gastroenterology

## 2024-04-19 ENCOUNTER — Other Ambulatory Visit (INDEPENDENT_AMBULATORY_CARE_PROVIDER_SITE_OTHER): Payer: Self-pay | Admitting: Gastroenterology

## 2024-04-19 ENCOUNTER — Encounter (INDEPENDENT_AMBULATORY_CARE_PROVIDER_SITE_OTHER): Payer: Self-pay | Admitting: Gastroenterology

## 2024-04-19 VITALS — BP 112/79 | HR 83 | Temp 97.1°F | Ht 64.0 in | Wt 187.1 lb

## 2024-04-19 DIAGNOSIS — K642 Third degree hemorrhoids: Secondary | ICD-10-CM | POA: Diagnosis not present

## 2024-04-19 DIAGNOSIS — K643 Fourth degree hemorrhoids: Secondary | ICD-10-CM

## 2024-04-19 MED ORDER — OMEPRAZOLE 40 MG PO CPDR: 40.0000 mg | DELAYED_RELEASE_CAPSULE | Freq: Two times a day (BID) | ORAL | 3 refills | Status: AC

## 2024-04-19 NOTE — Patient Instructions (Signed)
Continue to avoid straining.   Limit toilet time to 2-3 minutes at the most.   Avoid constipation. Take 2 tablespoons of natural wheat bran, natural oat bran, flax, Benefiber or any over the counter fiber supplement and increase your water intake to 7-8 glasses daily.  Occasionally, you may have more bleeding than usual after the banding procedure. This is often from the untreated hemorrhoids rather than the treated one. Don't be concerned if there is a tablespoon or so of blood. If there is more blood than this, lie flat with your bottom higher than your head and apply an ice pack to the area. If the bleeding does not stop within a half an hour or if you feel faint, have severe pain, chills, fever or difficulty passing urine (very rare) or other problems, you should call us at [phone] or report to the nearest emergency room. Please call me with any concerns!  The procedure you have had should have been relatively painless since the banding of the area involved does not have nerve endings and there is no pain sensation. The rubber band cuts off the blood supply to the hemorrhoid and the band may fall off as soon as 48 hours after the banding (the band may occasionally be seen in the toilet bowl following a bowel movement). You may notice a temporary feeling of fullness in the rectum which should respond adequately to plain Tylenol or Motrin.  I will see you back in follow-up and/or for additional banding.  Susan Viney L. Jeanmarie Hubert, MSN, APRN, AGNP-C Adult-Gerontology Nurse Practitioner [facility]

## 2024-04-19 NOTE — Procedures (Signed)
    [facility] BANDING PROCEDURE NOTE  Susan Wheeler is a 48 y.o. female presenting today for consideration of hemorrhoid banding. Last colonoscopy 12/2023 with hemorrhoids, diverticulosis   The patient presents with symptomatic grade III and IV hemorrhoids, unresponsive to maximal medical therapy, requesting rubber band ligation of his/her hemorrhoidal disease. All risks, benefits, and alternative forms of therapy were described and informed consent was obtained.  First banding 04/05/24: Right posterior. Tolerated well  The decision was made to band the Left lateral internal hemorrhoid, and the [facility] O'Regan System was used to perform band ligation without complication. Digital anorectal examination was then performed to assure proper positioning of the band, and to adjust the banded tissue as required. The patient was discharged home without pain or other issues. Dietary and behavioral recommendations were given, along with follow-up instructions. The patient will return in several weeks for followup and possible additional banding as required.  No complications were encountered and the patient tolerated the procedure well.   Cyle Kenyon L. Cadan Maggart, MSN, APRN, AGNP-C Adult-Gerontology Nurse Practitioner [facility] at [facility]

## 2024-05-03 ENCOUNTER — Ambulatory Visit: Admitting: Family Medicine

## 2024-05-03 ENCOUNTER — Encounter: Payer: Self-pay | Admitting: Family Medicine

## 2024-05-03 VITALS — BP 118/81 | HR 69 | Temp 98.1°F | Ht 64.5 in | Wt 183.0 lb

## 2024-05-03 DIAGNOSIS — E66811 Obesity, class 1: Secondary | ICD-10-CM

## 2024-05-03 DIAGNOSIS — G43709 Chronic migraine without aura, not intractable, without status migrainosus: Secondary | ICD-10-CM

## 2024-05-03 DIAGNOSIS — Z6830 Body mass index (BMI) 30.0-30.9, adult: Secondary | ICD-10-CM | POA: Diagnosis not present

## 2024-05-03 MED ORDER — TOPIRAMATE ER 50 MG PO CAP24: ORAL_CAPSULE | ORAL | 1 refills | Status: DC

## 2024-05-03 NOTE — Patient Instructions (Addendum)
 Move Topiramate  25 mg at bedtime to Trokendi  XR 50 mg capsule in the morning  Continue to stay mindful of food choices Remember your fruits and veggies Hydrate well with water  You can add a sugar free electrolye on your workout days  Great job adding in cardio and weight training!-- aim for 4 days/ wk  Aim for 8,000+ steps/ day  Add in Creatine Monohydrate once daily -- start with the lowest dose daily

## 2024-05-03 NOTE — Progress Notes (Signed)
 Office: [phone]  /  Fax: [phone]  WEIGHT SUMMARY AND BIOMETRICS  Starting Date: 03/22/24  Starting Weight: 195lb   Weight Lost Since Last Visit: 6lb   Vitals Temp: 98.1 F (36.7 C) BP: 118/81 Pulse Rate: 69 SpO2: 99 %   Body Composition  Body Fat %: 35.2 % Fat Mass (lbs): 64.4 lbs Muscle Mass (lbs): 112.6 lbs Total Body Water  (lbs): 80 lbs Visceral Fat Rating : 8     HPI  Chief Complaint: OBESITY  Susan Wheeler is here to discuss her progress with her obesity treatment plan. She is on the the Category 3 Plan and states she is following her eating plan approximately 90 % of the time. She states she is exercising 30-45 minutes 3-4 times per week.  Interval History:  Since last office visit she is down 6 lb She is net down 12 lb in one month of medically supervised weight management This is a 6.1% total body weight loss She has a good support system at home She has been going to the gym doing cardio and weight training 3-4 x a week She is staying mindful of food choices and portion sizes She has had occasional hunger She is doing fairly well on her category 3 meal plan  Pharmacotherapy: None  PHYSICAL EXAM:  Blood pressure 118/81, pulse 69, temperature 98.1 F (36.7 C), height 5' 4.5 (1.638 m), weight 183 lb (83 kg), last menstrual period 08/06/2015, SpO2 99%. Body mass index is 30.93 kg/m.  General: She is overweight, cooperative, alert, well developed, and in no acute distress. PSYCH: Has normal mood, affect and thought process.   Lungs: Normal breathing effort, no conversational dyspnea.   ASSESSMENT AND PLAN  TREATMENT PLAN FOR OBESITY:  Recommended Dietary Goals  Sherby is currently in the action stage of change. As such, her goal is to continue weight management plan. She has agreed to the Category 3 Plan.  Behavioral Intervention  We discussed the following Behavioral Modification Strategies today: increasing lean protein intake to  established goals, increasing fiber rich foods, increasing water  intake , work on meal planning and preparation, reading food labels , keeping healthy foods at home, work on managing stress, creating time for self-care and relaxation, avoiding temptations and identifying enticing environmental cues, continue to practice mindfulness when eating, planning for success, and continue to work on maintaining a reduced calorie state, getting the recommended amount of protein, incorporating whole foods, making healthy choices, staying well hydrated and practicing mindfulness when eating..  Additional resources provided today: NA  Recommended Physical Activity Goals  Ramonda has been advised to work up to 150 minutes of moderate intensity aerobic activity a week and strengthening exercises 2-3 times per week for cardiovascular health, weight loss maintenance and preservation of muscle mass.   She has agreed to Increase the intensity, frequency or duration of strengthening exercises  and Increase the intensity, frequency or duration of aerobic exercises   Aim for 8000 steps daily and adding in weight training at the gym 3-4 times a week consistently  Pharmacotherapy changes for the treatment of obesity: Change topiramate  25 mg once daily to Trokendi  XR 50 mg every morning for food impulse control and cravings and concurrent migraines  ASSOCIATED CONDITIONS ADDRESSED TODAY  Chronic migraine w/o aura, not intractable, w/o stat migr -     Topiramate  ER; 1 capsule po qAM  Dispense: 30 capsule; Refill: 1 Was taking topiramate  25 mg at bedtime for migraine prevention.  She notes an increase in headache frequency  She is not at risk for pregnancy status post hysterectomy She has been working on eating on schedule incorporating lean protein and fiber with meals along with hydrating better with water  She agrees to changing her topiramate  25 mg at bedtime to Trokendi  XR 50 mg in the morning to aid in both migraine  prevention and food impulse control and cravings  Class 1 obesity due to excess calories with body mass index (BMI) of 30.0 to 30.9 in adult, unspecified whether serious comorbidity present Continue prescribed diet Ramp up exercise as reviewed together on after visit summary was goals reviewed     She was informed of the importance of frequent follow up visits to maximize her success with intensive lifestyle modifications for her multiple health conditions.   ATTESTASTION STATEMENTS:  Reviewed by clinician on day of visit: allergies, medications, problem list, medical history, surgical history, family history, social history, and previous encounter notes pertinent to obesity diagnosis.   I have personally spent 30 minutes total time today in preparation, patient care, nutritional counseling and education,  and documentation for this visit, including the following: review of most recent clinical lab tests, prescribing medications/ refilling medications, reviewing medical assistant documentation, review and interpretation of bioimpedence results.     Darice Haddock, D.O. DABFM, DABOM [facility] [address] [place], [place] [address] [phone]

## 2024-05-05 ENCOUNTER — Encounter (INDEPENDENT_AMBULATORY_CARE_PROVIDER_SITE_OTHER): Admitting: Gastroenterology

## 2024-05-31 ENCOUNTER — Ambulatory Visit (INDEPENDENT_AMBULATORY_CARE_PROVIDER_SITE_OTHER): Admitting: Family Medicine

## 2024-05-31 ENCOUNTER — Encounter: Payer: Self-pay | Admitting: Family Medicine

## 2024-05-31 VITALS — BP 104/71 | HR 69 | Temp 98.5°F | Ht 64.5 in | Wt 179.0 lb

## 2024-05-31 DIAGNOSIS — G43709 Chronic migraine without aura, not intractable, without status migrainosus: Secondary | ICD-10-CM

## 2024-05-31 DIAGNOSIS — Z6830 Body mass index (BMI) 30.0-30.9, adult: Secondary | ICD-10-CM | POA: Diagnosis not present

## 2024-05-31 DIAGNOSIS — E66811 Obesity, class 1: Secondary | ICD-10-CM

## 2024-05-31 MED ORDER — TOPIRAMATE 50 MG PO TABS: 50.0000 mg | ORAL_TABLET | Freq: Two times a day (BID) | ORAL | 1 refills | Status: DC

## 2024-05-31 NOTE — Patient Instructions (Signed)
 Great job!!  Increase Topiramate  to 50 mg twice daily (breakfast and dinner time)  Hydrate well with water  with a goal of 100 oz/ day -- you can use sugar free packets or sugar free electrolytes   Keep a mid morning and mid afternoon snack - [place] and cheese roll ups + cut up cucumbers with hummus or  ranch (plain greek yogurt + powdered ranch) - [place] yogurt + add blueberries + [employer] cereal  Allow more non starchy veggies with dinner (salad greens, broccoli, cauliflower, asparagus, spinach, bell peppers, mushrooms, etc) OK to have 1/4 plate starch with dinner

## 2024-05-31 NOTE — Progress Notes (Signed)
 Office: [phone]  /  Fax: [phone]  WEIGHT SUMMARY AND BIOMETRICS  Starting Date: 03/22/24  Starting Weight: 195lb   Weight Lost Since Last Visit: 4lb   Vitals Temp: 98.5 F (36.9 C) BP: 104/71 Pulse Rate: 69 SpO2: 99 %   Body Composition  Body Fat %: 34.5 % Fat Mass (lbs): 61.8 lbs Muscle Mass (lbs): 111.6 lbs Total Body Water  (lbs): 80.8 lbs Visceral Fat Rating : 7    HPI  Chief Complaint: OBESITY  Susan Wheeler is here to discuss her progress with her obesity treatment plan. She is on the the Category 3 Plan and states she is following her eating plan approximately 90 % of the time. She states she is exercising 30-60 minutes 3-4 times per week.  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 Body mass index is 30.25 kg/m.  General: She is overweight, cooperative, alert, well developed, and in no acute distress. PSYCH: Has normal mood, affect and thought process.   Lungs: Normal breathing effort, no conversational dyspnea.   ASSESSMENT AND PLAN  TREATMENT PLAN FOR OBESITY:  Recommended Dietary Goals  Susan Wheeler is currently in the action stage of change. As such, her goal is to continue weight management plan. She has  agreed to the Category 3 Plan. Dietary adjustments reviewed on AVS with patient  Behavioral Intervention  We discussed the following Behavioral Modification Strategies today: increasing lean protein intake to established goals, increasing vegetables, increasing lower glycemic fruits, increasing fiber rich foods, increasing water  intake , work on meal planning and preparation, keeping healthy foods at home, decreasing eating out or consumption of processed foods, and making healthy choices when eating convenient foods, practice mindfulness eating and understand the difference between hunger signals and cravings, avoiding temptations and identifying enticing environmental cues, and continue to work on maintaining a reduced calorie state, getting the recommended amount of protein, incorporating whole foods, making healthy choices, staying well hydrated and practicing mindfulness when eating..  Additional resources provided today: NA  Recommended Physical Activity Goals  [name] has been advised to work up to 150 minutes of moderate intensity aerobic activity a week and strengthening exercises 2-3 times per week for cardiovascular health, weight loss maintenance and preservation of muscle mass.   She has agreed to [employer] strengthening exercises with a goal of 2-3 sessions a week  and Increase the intensity, frequency or duration of aerobic exercises    Pharmacotherapy changes for the treatment of obesity: change topiramate  to 50 mg bid  ASSOCIATED CONDITIONS ADDRESSED TODAY  Chronic migraine w/o aura, not intractable, w/o stat migr She continues to have migraines and has not seen a reduction in headache frequency on topiramate  50  mg once daily Denies adverse SE Lab Results  Component Value Date   NA 139 03/22/2024   K 4.3 03/22/2024   CO2 20 03/22/2024   GLUCOSE 93 03/22/2024   BUN 13 03/22/2024   CREATININE 1.04 (H) 03/22/2024   CALCIUM 9.8 03/22/2024   EGFR 68 03/22/2024   GFRNONAA >60  11/25/2022   Plan to repeat chemistry panel in the next 1-2 mos Increase topirmate to 50 mg bid -     Topiramate ; Take 1 tablet (50 mg total) by mouth 2 (two) times daily.  Dispense: 60 tablet; Refill: 1  Class 1 obesity due to excess calories with serious comorbidity and body mass index (BMI) of 30.0 to 30.9 in adult Improving Reviewed bioimpedence results Adjusted intake on diet to allow more fruits and veggies for added fiber to improve satiety Avoid use of Qsymia or Lomaira due to hx of chest pain from phentermine Lacks GLP1 RA coverage    She was informed of the importance of frequent follow up visits to maximize her success with intensive lifestyle modifications for her multiple health conditions.   ATTESTASTION STATEMENTS:  Reviewed by clinician on day of visit: allergies, medications, problem list, medical history, surgical history, family history, social history, and previous encounter notes pertinent to obesity diagnosis.   I have personally spent 22 minutes total time today in preparation, patient care, nutritional counseling and education,  and documentation for this visit, including the following: review of most recent clinical lab tests, prescribing medications/ refilling medications, reviewing medical assistant documentation, review and interpretation of bioimpedence results.     Darice Haddock, D.O. DABFM, DABOM [facility] [address] [place], [place] [address] [phone]

## 2024-06-15 ENCOUNTER — Other Ambulatory Visit: Payer: Self-pay | Admitting: Family Medicine

## 2024-06-28 ENCOUNTER — Encounter: Payer: Self-pay | Admitting: Family Medicine

## 2024-06-28 ENCOUNTER — Ambulatory Visit: Admitting: Family Medicine

## 2024-06-28 VITALS — BP 115/79 | HR 68 | Temp 98.0°F | Ht 64.5 in | Wt 174.0 lb

## 2024-06-28 DIAGNOSIS — Z6829 Body mass index (BMI) 29.0-29.9, adult: Secondary | ICD-10-CM

## 2024-06-28 DIAGNOSIS — G43709 Chronic migraine without aura, not intractable, without status migrainosus: Secondary | ICD-10-CM | POA: Diagnosis not present

## 2024-06-28 DIAGNOSIS — F32A Depression, unspecified: Secondary | ICD-10-CM | POA: Diagnosis not present

## 2024-06-28 DIAGNOSIS — F419 Anxiety disorder, unspecified: Secondary | ICD-10-CM

## 2024-06-28 DIAGNOSIS — E663 Overweight: Secondary | ICD-10-CM | POA: Diagnosis not present

## 2024-06-28 MED ORDER — TOPIRAMATE 50 MG PO TABS: 50.0000 mg | ORAL_TABLET | Freq: Two times a day (BID) | ORAL | 1 refills | Status: DC

## 2024-06-28 NOTE — Progress Notes (Signed)
 Office: [phone]  /  Fax: [phone]  WEIGHT SUMMARY AND BIOMETRICS  Starting Date: 03/22/24  Starting Weight: 195lb   Weight Lost Since Last Visit: 5lb   Vitals Temp: 98 F (36.7 C) BP: 115/79 Pulse Rate: 68 SpO2: 100 %   Body Composition  Body Fat %: 34.1 % Fat Mass (lbs): 59.4 lbs Muscle Mass (lbs): 108.8 lbs Total Body Water  (lbs): 79.2 lbs Visceral Fat Rating : 7   HPI  Chief Complaint: OBESITY  Susan Wheeler is here to discuss her progress with her obesity treatment plan. She is on the the Category 3 Plan and states she is following her eating plan approximately 90-95 % of the time. She states she is exercising 30-60 minutes 2-4 times per week.  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 Body mass index is 29.41 kg/m.  General: She is healthy appearing, cooperative, alert, well developed, and in no acute distress. PSYCH: Has normal mood, affect and thought process.   Lungs: Normal breathing effort, no conversational dyspnea.   ASSESSMENT AND PLAN  TREATMENT PLAN FOR OBESITY:  Recommended Dietary Goals  [name] is currently in the  action stage of change. As such, her goal is to continue weight management plan. She has agreed to the Category 3 Plan.  Behavioral Intervention  We discussed the following Behavioral Modification Strategies today: increasing lean protein intake to established goals, increasing fiber rich foods, increasing water  intake , work on meal planning and preparation, keeping healthy foods at home, avoiding temptations and identifying enticing environmental cues, continue to practice mindfulness when eating, continue to work on maintaining a reduced calorie state, getting the recommended amount of protein, incorporating whole foods, making healthy choices, staying well hydrated and practicing mindfulness when eating., and increase protein intake, fibrous foods (25 grams per day for women, 30 grams for men) and water  to improve satiety and decrease hunger signals. .  Additional resources provided today: NA  Recommended Physical Activity Goals  Diannie has been advised to work up to 150 minutes of moderate intensity aerobic activity a week and strengthening exercises 2-3 times per week for cardiovascular health, weight loss maintenance and preservation of muscle mass.   She has agreed to Increase the intensity, frequency or duration of strengthening exercises  and Increase the intensity, frequency or duration of aerobic exercises   Add in 1-2 additional days of home or gym exercises Focus on strength training 3 days/ wk consistently Track daily steps with a smart watch  Pharmacotherapy changes for the treatment of obesity: none  ASSOCIATED  CONDITIONS ADDRESSED TODAY  Chronic migraine w/o aura, not intractable, w/o stat migr Headaches have reduced in frequency on topiramate  50 mg bid Denies adverse SE Continue topirmate dose  Repeat BMP next visit -     Topiramate ; Take 1 tablet (50 mg total) by mouth 2 (two) times daily.  Dispense: 60 tablet; Refill: 1  Overweight (BMI 25.0-29.9)  BMI  29.0-29.9,adult  Anxiety and depression Stable She reports a stable mood on Paxil  20 mg daily Notably, Paxil  can be weight promoting and she can talk to her PCP about additional options She has had a reduction in emotional eating and is doing much better with self care and nutrition    She was informed of the importance of frequent follow up visits to maximize her success with intensive lifestyle modifications for her multiple health conditions.   ATTESTASTION STATEMENTS:  Reviewed by clinician on day of visit: allergies, medications, problem list, medical history, surgical history, family history, social history, and previous encounter notes pertinent to obesity diagnosis.   I have personally spent 30 minutes total time today in preparation, patient care, nutritional counseling and education,  and documentation for this visit, including the following: review of most recent clinical lab tests, prescribing medications/ refilling medications, reviewing medical assistant documentation, review and interpretation of bioimpedence results.     Darice Haddock, D.O. DABFM, DABOM [facility] [address] [place], [place] [address] [phone]

## 2024-06-28 NOTE — Patient Instructions (Addendum)
 Lactaid PROTEIN (fat free, 1% or 2% milk) OK Carb Master fat free milk or 1% milk or 2% milk from [employer] Silk Unsweetened Almond blend milk HIGH PROTEIN   Track steps-- aim for 10,000 steps 6 days/ wk  Add in resistance training exercises at home -- to make 3+ days/ wk for resistance training

## 2024-07-08 ENCOUNTER — Telehealth: Payer: Self-pay | Admitting: Family Medicine

## 2024-07-08 NOTE — Telephone Encounter (Signed)
 LVM to inform patient that Susan Wheeler will be virtual only before her maternity leave- if she cannot do virtual for 9/26 appt will have to reschedule with another provider

## 2024-07-26 ENCOUNTER — Ambulatory Visit: Admitting: Family Medicine

## 2024-07-26 ENCOUNTER — Encounter: Payer: Self-pay | Admitting: Family Medicine

## 2024-07-26 VITALS — BP 123/83 | HR 81 | Temp 98.6°F | Ht 64.5 in | Wt 174.0 lb

## 2024-07-26 DIAGNOSIS — E559 Vitamin D deficiency, unspecified: Secondary | ICD-10-CM

## 2024-07-26 DIAGNOSIS — G43709 Chronic migraine without aura, not intractable, without status migrainosus: Secondary | ICD-10-CM

## 2024-07-26 DIAGNOSIS — R634 Abnormal weight loss: Secondary | ICD-10-CM | POA: Diagnosis not present

## 2024-07-26 DIAGNOSIS — E663 Overweight: Secondary | ICD-10-CM

## 2024-07-26 DIAGNOSIS — Z6829 Body mass index (BMI) 29.0-29.9, adult: Secondary | ICD-10-CM

## 2024-07-26 MED ORDER — TOPIRAMATE 50 MG PO TABS: 50.0000 mg | ORAL_TABLET | Freq: Two times a day (BID) | ORAL | 1 refills | Status: DC

## 2024-07-26 NOTE — Progress Notes (Signed)
 Office: [phone]  /  Fax: [phone]  WEIGHT SUMMARY AND BIOMETRICS  Starting Date: 03/22/24  Starting Weight: 195lb   Weight Lost Since Last Visit: 0lb   Vitals Temp: 98.6 F (37 C) BP: 123/83 Pulse Rate: 81 SpO2: 100 %   Body Composition  Body Fat %: 34.2 % Fat Mass (lbs): 59.6 lbs Muscle Mass (lbs): 108.8 lbs Total Body Water  (lbs): 78.6 lbs Visceral Fat Rating : 7    HPI  Chief Complaint: OBESITY  Ama is here to discuss her progress with her obesity treatment plan. She is on the the Category 3 Plan and states she is following her eating plan approximately 80-85 % of the time. She states she is exercising 30-60 minutes 1-2 times per week.  Interval History:  Since last office visit she is down 0 lb She did go to her daughter's wedding since last visit She hasn't been walking as much this month She does plan to go back to the gym She remains fairly mindful of food choices This gives her a net weight loss of 25 lb in 4 mos This is a 12.8% TBW loss She has not been tracking her calories and is eating off plan more  Pharmacotherapy: topiramate  50 mg bid  PHYSICAL EXAM:  Blood pressure 123/83, pulse 81, temperature 98.6 F (37 C), height 5' 4.5 (1.638 m), weight 174 lb (78.9 kg), last menstrual period 08/06/2015, SpO2 100%. Body mass index is 29.41 kg/m.  General: She is healthy appearing, cooperative, alert, well developed, and in no acute distress. PSYCH: Has normal mood, affect and thought process.   Lungs: Normal breathing effort, no conversational dyspnea.   ASSESSMENT AND PLAN  TREATMENT PLAN FOR OBESITY:  Recommended Dietary Goals  Marsheila is currently in the action stage of change. As such, her goal is to continue weight management plan. She has agreed to keeping a food journal and adhering to recommended goals of 1500 calories and 90+ g of protein. Instructed on creating new meal plans using Chat GPT app  Behavioral  Intervention  We discussed the following Behavioral Modification Strategies today: increasing lean protein intake to established goals, increasing fiber rich foods, increasing water  intake , work on meal planning and preparation, keeping healthy foods at home, practice mindfulness eating and understand the difference between hunger signals and cravings, work on managing stress, creating time for self-care and relaxation, and planning for success.  Additional resources provided today: NA  Recommended Physical Activity Goals  Charisa has been advised to work up to 150 minutes of moderate intensity aerobic activity a week and strengthening exercises 2-3 times per week for cardiovascular health, weight loss maintenance and preservation of muscle mass.   She has agreed to Increase the intensity, frequency or duration of aerobic exercises   Aim for outdoor or gym workouts 3+ days/ wk  Pharmacotherapy changes for the treatment of obesity: none  ASSOCIATED CONDITIONS ADDRESSED TODAY  Vitamin D  insufficiency Last vitamin D  Lab Results  Component Value Date   VD25OH 37.3 03/22/2024  She is off of a vitamin D  supplement Energy levels are improved Check level today with a goal > 50  -     VITAMIN D  25 Hydroxy (Vit-D Deficiency, Fractures)  Chronic migraine w/o aura, not intractable, w/o stat migr Improving on topiramate  50 mg bid without adverse SE Notices a reduction in migraine frequency Check chemistry panel today -     Topiramate ; Take 1 tablet (50 mg total) by mouth 2 (two) times daily.  Dispense: 60 tablet; Refill: 1  Weight loss -     Comprehensive metabolic panel with GFR  Overweight (BMI 25.0-29.9)  BMI 29.0-29.9,adult     She was informed of the importance of frequent follow up visits to maximize her success with intensive lifestyle modifications for her multiple health conditions.   ATTESTASTION STATEMENTS:  Reviewed by clinician on day of visit: allergies, medications,  problem list, medical history, surgical history, family history, social history, and previous encounter notes pertinent to obesity diagnosis.   I have personally spent 30 minutes total time today in preparation, patient care, nutritional counseling and education,  and documentation for this visit, including the following: review of most recent clinical lab tests, prescribing medications/ refilling medications, reviewing medical assistant documentation, review and interpretation of bioimpedence results.     Darice Haddock, D.O. DABFM, DABOM [facility] [address] [place], [place] [address] [phone]

## 2024-07-26 NOTE — Patient Instructions (Signed)
 You are welcome to use the Chat GPT app to create a new meal plan-- search 1500 calorie meal plan (without eggs if you didn't want it)  You can swap out meals/ snacks but keep calories constant You can search for recipes for dinner (450-550 calorie dinner ideas)  Aim for 45-60 min of walking 5 days/ wk Great job with reducing sugar intake!

## 2024-07-27 LAB — COMPREHENSIVE METABOLIC PANEL WITH GFR
ALT: 34 IU/L — ABNORMAL HIGH (ref 0–32)
AST: 27 IU/L (ref 0–40)
Albumin: 4.2 g/dL (ref 3.9–4.9)
Alkaline Phosphatase: 72 IU/L (ref 41–116)
BUN/Creatinine Ratio: 15 (ref 9–23)
BUN: 17 mg/dL (ref 6–24)
Bilirubin Total: 0.3 mg/dL (ref 0.0–1.2)
CO2: 21 mmol/L (ref 20–29)
Calcium: 9.6 mg/dL (ref 8.7–10.2)
Chloride: 101 mmol/L (ref 96–106)
Creatinine, Ser: 1.13 mg/dL — ABNORMAL HIGH (ref 0.57–1.00)
Globulin, Total: 2.5 g/dL (ref 1.5–4.5)
Glucose: 75 mg/dL (ref 70–99)
Potassium: 4.2 mmol/L (ref 3.5–5.2)
Sodium: 138 mmol/L (ref 134–144)
Total Protein: 6.7 g/dL (ref 6.0–8.5)
eGFR: 61 mL/min/1.73 (ref >59)

## 2024-07-27 LAB — VITAMIN D 25 HYDROXY (VIT D DEFICIENCY, FRACTURES): Vit D, 25-Hydroxy: 57.1 ng/mL (ref 30.0–100.0)

## 2024-07-28 ENCOUNTER — Ambulatory Visit: Payer: Self-pay | Admitting: Family Medicine

## 2024-07-28 DIAGNOSIS — K760 Fatty (change of) liver, not elsewhere classified: Secondary | ICD-10-CM | POA: Insufficient documentation

## 2024-07-29 ENCOUNTER — Telehealth: Admitting: Family Medicine

## 2024-08-23 ENCOUNTER — Ambulatory Visit: Admitting: Family Medicine

## 2024-08-23 ENCOUNTER — Encounter: Payer: Self-pay | Admitting: Family Medicine

## 2024-08-23 VITALS — BP 109/78 | HR 66 | Temp 98.3°F | Ht 64.5 in | Wt 172.0 lb

## 2024-08-23 DIAGNOSIS — E663 Overweight: Secondary | ICD-10-CM

## 2024-08-23 DIAGNOSIS — G43709 Chronic migraine without aura, not intractable, without status migrainosus: Secondary | ICD-10-CM | POA: Diagnosis not present

## 2024-08-23 DIAGNOSIS — K760 Fatty (change of) liver, not elsewhere classified: Secondary | ICD-10-CM | POA: Diagnosis not present

## 2024-08-23 DIAGNOSIS — Z6829 Body mass index (BMI) 29.0-29.9, adult: Secondary | ICD-10-CM

## 2024-08-23 DIAGNOSIS — E559 Vitamin D deficiency, unspecified: Secondary | ICD-10-CM

## 2024-08-23 MED ORDER — TOPIRAMATE 50 MG PO TABS: 50.0000 mg | ORAL_TABLET | Freq: Every day | ORAL | 2 refills | Status: DC

## 2024-08-23 NOTE — Progress Notes (Signed)
 Office: [phone]  /  Fax: [phone]  WEIGHT SUMMARY AND BIOMETRICS  Starting Date: 03/22/24  Starting Weight: 195lb   Weight Lost Since Last Visit: 2lb   Vitals Temp: 98.3 F (36.8 C) BP: 109/78 Pulse Rate: 66 SpO2: 100 %   Body Composition  Body Fat %: 35.3 % Fat Mass (lbs): 60.8 lbs Muscle Mass (lbs): 105.8 lbs Total Body Water  (lbs): 80 lbs Visceral Fat Rating : 7     HPI  Chief Complaint: OBESITY  Susan Wheeler is here to discuss her progress with her obesity treatment plan. She is on the the Category 3 Plan and states she is following her eating plan approximately 95 % of the time. She states she is exercising 0 minutes 0 times per week.   Interval History:  Since last office visit she is down 2 lb This gives her net weight loss of 27 pounds in the past 5 months medically supervised weight management This is a 13.8% total body weight loss She is still mindful of food choices and meal planning She is sticking to cat 3 meal plan Has occasional hunger and cravings She is trying to keep junk food out of the house (though her husband does buy junk food) She has not been exercising as much due to time constraints She has been having to take care of her mother-in-law who has had memory changes and lives 45 minutes away  Pharmacotherapy: Topiramate  50 mg at bedtime  PHYSICAL EXAM:  Blood pressure 109/78, pulse 66, temperature 98.3 F (36.8 C), height 5' 4.5 (1.638 m), weight 172 lb (78 kg), last menstrual period 08/06/2015, SpO2 100%. Body mass index is 29.07 kg/m.  General: She is appearing cooperative, alert, well developed, and in no acute distress. PSYCH: Has normal mood, affect and thought process.   Lungs: Normal breathing effort, no conversational dyspnea.   ASSESSMENT AND PLAN  TREATMENT PLAN FOR OBESITY:  Recommended Dietary Goals  Susan Wheeler is currently in the action stage of change. As such, her goal is to continue weight management  plan. She has agreed to the Category 3 Plan.  Behavioral Intervention  We discussed the following Behavioral Modification Strategies today: increasing lean protein intake to established goals, increasing water  intake , work on meal planning and preparation, keeping healthy foods at home, work on managing stress, creating time for self-care and relaxation, avoiding temptations and identifying enticing environmental cues, planning for success, and avoid all or none thinking.  Additional resources provided today: NA  Recommended Physical Activity Goals  Susan Wheeler has been advised to work up to 150 minutes of moderate intensity aerobic activity a week and strengthening exercises 2-3 times per week for cardiovascular health, weight loss maintenance and preservation of muscle mass.   She has agreed to Start aerobic activity with a goal of 150 minutes a week at moderate intensity.  Resume gym exercise or outdoor walks for at least 3 days a week, 60 minutes  Pharmacotherapy changes for the treatment of obesity: None  ASSOCIATED CONDITIONS ADDRESSED TODAY  Fatty liver Assessment & Plan:  Fibrosis 4 Score = .69 (Low risk)        Interpretation for patients with NAFLD          <1.30       -  F0-F1 (Low risk)          1.30-2.67 -  Indeterminate           >2.67      -  F3-F4 (High risk)  Validated for ages 59-65 Fib 4 score calculated as ALT was mildly elevated on recent chemistry panel at 34, reviewed with patient today.  Fib 4 score was too low for elastography. Continue to work on achieving a body fat closer to 30%        Chronic migraine w/o aura, not intractable, w/o stat migr Improving with a reduction of headache frequency using topiramate .  She did reduce from 50 mg twice a day to 50 mg at bedtime due to brain fog.  Chemistry panel was reviewed and up-to-date. -     Topiramate ; Take 1 tablet (50 mg total) by mouth daily.  Dispense: 30 tablet; Refill: 2  Overweight (BMI  25.0-29.9) Improving.  We set a goal weight for 165 pounds with a goal of achieving this within 3 months.  BMI 29.0-29.9,adult  Vitamin D  insufficiency Last vitamin D  Lab Results  Component Value Date   VD25OH 57.1 07/26/2024  Improved.  Her vitamin D  level has improved.  She may reduce her vitamin D  down to 2000 IU once daily for maintenance.      She was informed of the importance of frequent follow up visits to maximize her success with intensive lifestyle modifications for her multiple health conditions.   ATTESTASTION STATEMENTS:  Reviewed by clinician on day of visit: allergies, medications, problem list, medical history, surgical history, family history, social history, and previous encounter notes pertinent to obesity diagnosis.   I have personally spent 30 minutes total time today in preparation, patient care, nutritional counseling and education,  and documentation for this visit, including the following: review of most recent clinical lab tests, prescribing medications/ refilling medications, reviewing medical assistant documentation, review and interpretation of bioimpedence results.     Darice Haddock, D.O. DABFM, DABOM [facility] [address] [place], [place] [address] [phone]

## 2024-08-23 NOTE — Assessment & Plan Note (Signed)
 Fibrosis 4 Score = .69 (Low risk)        Interpretation for patients with NAFLD          <1.30       -  F0-F1 (Low risk)          1.30-2.67 -  Indeterminate           >2.67      -  F3-F4 (High risk)     Validated for ages 70-65

## 2024-08-23 NOTE — Patient Instructions (Signed)
 Change vitamin D  to 2,000 international units  daily for maintenance

## 2024-10-04 ENCOUNTER — Ambulatory Visit (INDEPENDENT_AMBULATORY_CARE_PROVIDER_SITE_OTHER): Admitting: Family Medicine

## 2024-10-04 ENCOUNTER — Encounter: Payer: Self-pay | Admitting: Family Medicine

## 2024-10-04 VITALS — BP 115/79 | HR 71 | Temp 98.6°F | Ht 64.5 in | Wt 172.0 lb

## 2024-10-04 DIAGNOSIS — E663 Overweight: Secondary | ICD-10-CM

## 2024-10-04 DIAGNOSIS — Z6829 Body mass index (BMI) 29.0-29.9, adult: Secondary | ICD-10-CM | POA: Diagnosis not present

## 2024-10-04 DIAGNOSIS — K760 Fatty (change of) liver, not elsewhere classified: Secondary | ICD-10-CM

## 2024-10-04 DIAGNOSIS — G43709 Chronic migraine without aura, not intractable, without status migrainosus: Secondary | ICD-10-CM | POA: Diagnosis not present

## 2024-10-04 MED ORDER — TOPIRAMATE 50 MG PO TABS: 50.0000 mg | ORAL_TABLET | Freq: Every day | ORAL | 2 refills | Status: AC

## 2024-10-04 NOTE — Progress Notes (Signed)
 Office: [phone]  /  Fax: [phone]  WEIGHT SUMMARY AND BIOMETRICS  Starting Date: 03/22/24  Starting Weight: 195lb   Weight Lost Since Last Visit: 0lb   Vitals Temp: 98.6 F (37 C) BP: 115/79 Pulse Rate: 71 SpO2: 100 %   Body Composition  Body Fat %: 35.4 % Fat Mass (lbs): 61 lbs Muscle Mass (lbs): 105.8 lbs Total Body Water  (lbs): 78.6 lbs Visceral Fat Rating : 7    HPI  Chief Complaint: OBESITY  Susan Wheeler is here to discuss her progress with her obesity treatment plan. She is on the the Category 3 Plan and states she is following her eating plan approximately 85-90 % of the time. She states she is exercising 0 minutes 0 times per week.  Interval History:  Since last office visit she is down 0 lb She is still mindful of food choices, eating on plan Occasional hunger and cravings, under control She maintained her muscle mass since last visit She is practicing mindful eating Her husband has been more supportive She purchased resistance bands for home use She is helping out her MIL who has dementia (but now her husband is also helping, so she has a bit more free time outside of work) She has not made it to the gym much  Pharmacotherapy: Topamax  50 mg daily for migraine prevention Hx of phentermine adverse side effect  PHYSICAL EXAM:  Blood pressure 115/79, pulse 71, temperature 98.6 F (37 C), height 5' 4.5 (1.638 m), weight 172 lb (78 kg), last menstrual period 08/06/2015, SpO2 100%. Body mass index is 29.07 kg/m.  General: She is healthy appearing, cooperative, alert, well developed, and in no acute distress. PSYCH: Has normal mood, affect and thought process.   Lungs: Normal breathing effort, no conversational dyspnea.   ASSESSMENT AND PLAN  TREATMENT PLAN FOR OBESITY:  Recommended Dietary Goals  [name] is currently in the action stage of change. As such, her goal is to continue weight management plan. She has agreed to the Category 3  Plan.  Behavioral Intervention  We discussed the following Behavioral Modification Strategies today: increasing lean protein intake to established goals, increasing fiber rich foods, increasing water  intake , work on meal planning and preparation, keeping healthy foods at home, work on managing stress, creating time for self-care and relaxation, avoiding temptations and identifying enticing environmental cues, continue to practice mindfulness when eating, planning for success, and celebration eating strategies.  Additional resources provided today: NA  Recommended Physical Activity Goals  [name] has been advised to work up to 150 minutes of moderate intensity aerobic activity a week and strengthening exercises 2-3 times per week for cardiovascular health, weight loss maintenance and preservation of muscle mass.   She has agreed to Increase the intensity, frequency or duration of strengthening exercises  and Increase the intensity, frequency or duration of aerobic exercises   Aim for home or gym workouts 3 days/ wk  Pharmacotherapy changes for the treatment of obesity: none  ASSOCIATED CONDITIONS ADDRESSED TODAY  Fatty liver  Fibrosis 4 Score = .69 (Low risk)        Interpretation for patients with NAFLD          <1.30       -  F0-F1 (Low risk)          1.30-2.67 -  Indeterminate           >2.67      -  F3-F4 (High risk)     Validated for ages 28-65 Doing  well with weight loss, down 13.8% TBW loss in 6 mos Fasting insulin  <15 Continue to work on a low sugar/ low saturated fat diet, regular exercise and a goal body fat %<35.      Score is based on outdated labs. 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  Chemistry panel is up-to-date. -     Topiramate ; Take 1 tablet (50 mg  total) by mouth daily.  Dispense: 30 tablet; Refill: 2  Overweight (BMI 25.0-29.9) Improving and within 7 pounds of her target weight.  We discussed importance of regular exercise for additional weight loss along with cardiometabolic benefit.  Continue category 3 meal plan.  Topiramate  for migraine prevention has also helped with food impulse control.  BMI 29.0-29.9,adult      She was informed of the importance of frequent follow up visits to maximize her success with intensive lifestyle modifications for her multiple health conditions.   ATTESTASTION STATEMENTS:  Reviewed by clinician on day of visit: allergies, medications, problem list, medical history, surgical history, family history, social history, and previous encounter notes pertinent to obesity diagnosis.   I have personally spent 30 minutes total time today in preparation, patient care, nutritional counseling and education,  and documentation for this visit, including the following: review of most recent clinical lab tests, prescribing medications/ refilling medications, reviewing medical assistant documentation, review and interpretation of bioimpedence results.     Darice Haddock, D.O. DABFM, DABOM [facility] [address] [place], [place] [address] [phone]

## 2024-11-29 ENCOUNTER — Ambulatory Visit: Admitting: Family Medicine

## 2024-12-13 ENCOUNTER — Ambulatory Visit: Admitting: Family Medicine
